# Patient Record
Sex: Female | Born: 1945 | Race: Black or African American | Hispanic: No | Marital: Married | Smoking: Former smoker
Health system: Southern US, Community
[De-identification: ages and names within clinical notes are randomized; demographics above are authoritative.]

## PROBLEM LIST (undated history)

## (undated) DIAGNOSIS — G8929 Other chronic pain: Secondary | ICD-10-CM

## (undated) DIAGNOSIS — I10 Essential (primary) hypertension: Secondary | ICD-10-CM

## (undated) DIAGNOSIS — I509 Heart failure, unspecified: Secondary | ICD-10-CM

## (undated) DIAGNOSIS — M47816 Spondylosis without myelopathy or radiculopathy, lumbar region: Secondary | ICD-10-CM

## (undated) DIAGNOSIS — R918 Other nonspecific abnormal finding of lung field: Secondary | ICD-10-CM

## (undated) DIAGNOSIS — F410 Panic disorder [episodic paroxysmal anxiety] without agoraphobia: Secondary | ICD-10-CM

## (undated) DIAGNOSIS — R601 Generalized edema: Secondary | ICD-10-CM

## (undated) DIAGNOSIS — E049 Nontoxic goiter, unspecified: Secondary | ICD-10-CM

## (undated) HISTORY — DX: Generalized edema: R60.1

## (undated) HISTORY — DX: Other chronic pain: G89.29

## (undated) HISTORY — DX: Other nonspecific abnormal finding of lung field: R91.8

## (undated) HISTORY — DX: Essential (primary) hypertension: I10

## (undated) HISTORY — DX: Nontoxic goiter, unspecified: E04.9

## (undated) HISTORY — PX: VAGINAL HYSTERECTOMY: SUR661

## (undated) HISTORY — DX: Spondylosis without myelopathy or radiculopathy, lumbar region: M47.816

## (undated) HISTORY — DX: Panic disorder (episodic paroxysmal anxiety): F41.0

---

## 1998-08-04 ENCOUNTER — Ambulatory Visit: Admission: RE | Admit: 1998-08-04 | Discharge: 1998-08-04 | Payer: Self-pay | Admitting: Gynecologic Oncology

## 1998-08-10 ENCOUNTER — Inpatient Hospital Stay (HOSPITAL_COMMUNITY): Admission: RE | Admit: 1998-08-10 | Discharge: 1998-08-13 | Payer: Self-pay | Admitting: Gynecology

## 1998-09-22 ENCOUNTER — Ambulatory Visit: Admission: RE | Admit: 1998-09-22 | Discharge: 1998-09-22 | Payer: Self-pay | Admitting: Gynecology

## 2005-04-24 HISTORY — PX: HERNIA REPAIR: SHX51

## 2012-02-12 ENCOUNTER — Encounter: Payer: Self-pay | Admitting: Critical Care Medicine

## 2012-02-13 ENCOUNTER — Encounter: Payer: Self-pay | Admitting: Critical Care Medicine

## 2012-02-13 ENCOUNTER — Ambulatory Visit (INDEPENDENT_AMBULATORY_CARE_PROVIDER_SITE_OTHER): Payer: BC Managed Care – PPO | Admitting: Critical Care Medicine

## 2012-02-13 ENCOUNTER — Other Ambulatory Visit (INDEPENDENT_AMBULATORY_CARE_PROVIDER_SITE_OTHER): Payer: BC Managed Care – PPO

## 2012-02-13 ENCOUNTER — Ambulatory Visit (HOSPITAL_COMMUNITY)
Admission: RE | Admit: 2012-02-13 | Discharge: 2012-02-13 | Disposition: A | Discharge status: Home / Self Care | Payer: BC Managed Care – PPO | Source: Ambulatory Visit | Attending: Critical Care Medicine | Admitting: Critical Care Medicine

## 2012-02-13 VITALS — BP 146/66 | HR 74 | Temp 98.2°F | Ht 64.0 in | Wt 204.6 lb

## 2012-02-13 DIAGNOSIS — R609 Edema, unspecified: Secondary | ICD-10-CM

## 2012-02-13 DIAGNOSIS — R0600 Dyspnea, unspecified: Secondary | ICD-10-CM

## 2012-02-13 DIAGNOSIS — R0989 Other specified symptoms and signs involving the circulatory and respiratory systems: Secondary | ICD-10-CM

## 2012-02-13 DIAGNOSIS — I10 Essential (primary) hypertension: Secondary | ICD-10-CM | POA: Insufficient documentation

## 2012-02-13 DIAGNOSIS — E049 Nontoxic goiter, unspecified: Secondary | ICD-10-CM | POA: Insufficient documentation

## 2012-02-13 DIAGNOSIS — I700 Atherosclerosis of aorta: Secondary | ICD-10-CM | POA: Insufficient documentation

## 2012-02-13 DIAGNOSIS — R918 Other nonspecific abnormal finding of lung field: Secondary | ICD-10-CM

## 2012-02-13 DIAGNOSIS — M47817 Spondylosis without myelopathy or radiculopathy, lumbosacral region: Secondary | ICD-10-CM

## 2012-02-13 DIAGNOSIS — I5032 Chronic diastolic (congestive) heart failure: Secondary | ICD-10-CM

## 2012-02-13 DIAGNOSIS — R601 Generalized edema: Secondary | ICD-10-CM

## 2012-02-13 DIAGNOSIS — G8929 Other chronic pain: Secondary | ICD-10-CM

## 2012-02-13 DIAGNOSIS — M47816 Spondylosis without myelopathy or radiculopathy, lumbar region: Secondary | ICD-10-CM | POA: Insufficient documentation

## 2012-02-13 DIAGNOSIS — F410 Panic disorder [episodic paroxysmal anxiety] without agoraphobia: Secondary | ICD-10-CM

## 2012-02-13 LAB — HEPATIC FUNCTION PANEL
ALT: 13 U/L (ref 0–35)
Alkaline Phosphatase: 81 U/L (ref 39–117)
Bilirubin, Direct: 0.1 mg/dL (ref 0.0–0.3)
Total Protein: 7.2 g/dL (ref 6.0–8.3)

## 2012-02-13 LAB — PROTIME-INR: INR: 1 ratio (ref 0.8–1.0)

## 2012-02-13 NOTE — Assessment & Plan Note (Signed)
Dyspnea ? Etiology.  Liver enlarged.  ?ascites.  Mild peripheral edema.  RHC showed mod pulm hypertension and LV diastolic dysfunction.  No recent Echo Plan No change in medications An echocardiogram will be obtained Liver function labs,  BNP,  Pulmonary function studies Ultrasound of liver will be obtained An overnight sleep oximetry will be obtained

## 2012-02-13 NOTE — Progress Notes (Signed)
Subjective:    Patient ID: Jenkins Rouge, female    DOB: 1946-02-15, 66 y.o.   MRN: [id number]  HPI Comments: Chronic dyspnea with exertion and leg/abd swelling for 2 months.   Shortness of Breath This is a chronic problem. The current episode started more than 1 month ago. The problem occurs daily (exertional only). The problem has been gradually worsening. Associated symptoms include leg swelling, orthopnea and a rash. Pertinent negatives include no abdominal pain, chest pain, coryza, ear pain, fever, headaches, neck pain, PND, rhinorrhea, sore throat, sputum production, swollen glands, syncope, vomiting or wheezing. The symptoms are aggravated by exercise, lying flat and occupational exposure (heavy exertion only, worked as a Programmer, applications with black dust ). Risk factors include smoking. She has tried nothing for the symptoms. Her past medical history is significant for pneumonia. There is no history of allergies, aspirin allergies, asthma, bronchiolitis, CAD, chronic lung disease, COPD, DVT, a heart failure, PE or a recent surgery. (PNA with admission :  last episode 11/12)   Past Medical History  Diagnosis Date  . Hypertension   . Chronic pain   . Goiter   . Panic attacks   . Anasarca   . Lumbar arthropathy     Pain clinic  . Multiple lung nodules     worked in Psychiatrist 79yrs: retired 2009     Family History  Problem Relation Age of Onset  . Heart failure Mother   . Hypertension Mother   . Hypertension Maternal Grandfather   . Hypertension Maternal Grandmother   . Stomach cancer Sister   . Diabetes type II Mother   . Diabetes Mellitus II Sister   . Cancer Father     mets everywhere  . Cancer Sister     stomach     History   Social History  . Marital Status: Married    Spouse Name: N/A    Number of Children: 3  . Years of Education: N/A   Occupational History  . retired     Optician, dispensing   Social History Main Topics  . Smoking status: Former Smoker  -- 0.5 packs/day for 20 years    Types: Cigarettes    Quit date: 04/24/1988  . Smokeless tobacco: Never Used  . Alcohol Use: No  . Drug Use: No  . Sexually Active: Not on file   Other Topics Concern  . Not on file   Social History Narrative  . No narrative on file     Allergies  Allergen Reactions  . Prednisone     "does thing and does not know she has done them" once taking this med  . Sulfa Antibiotics     Rash, SOB     Outpatient Prescriptions Prior to Visit  Medication Sig Dispense Refill  . ALPRAZolam (XANAX) 1 MG tablet Take 1 mg by mouth 3 (three) times daily as needed.      . cloNIDine (CATAPRES) 0.1 MG tablet Take 0.1 mg by mouth 2 (two) times daily.      Marland Kitchen diltiazem (TIAZAC) 180 MG 24 hr capsule Take 180 mg by mouth daily.      . furosemide (LASIX) 40 MG tablet 2 1/2 daily      . lisinopril (PRINIVIL,ZESTRIL) 20 MG tablet Take 20 mg by mouth daily.      Marland Kitchen oxycodone (ROXICODONE) 30 MG immediate release tablet Take 30 mg by mouth every 12 (twelve) hours as needed.      Marland Kitchen oxyCODONE-acetaminophen (PERCOCET) 7.5-500 MG  per tablet Take 1 tablet by mouth daily as needed.      . potassium chloride SA (K-DUR,KLOR-CON) 20 MEQ tablet Take 20 mEq by mouth daily.           Review of Systems  Constitutional: Positive for chills and fatigue. Negative for fever, diaphoresis, activity change, appetite change and unexpected weight change.  HENT: Positive for congestion and postnasal drip. Negative for hearing loss, ear pain, nosebleeds, sore throat, facial swelling, rhinorrhea, sneezing, mouth sores, trouble swallowing, neck pain, neck stiffness, dental problem, voice change, sinus pressure, tinnitus and ear discharge.   Eyes: Negative for photophobia, discharge, itching and visual disturbance.  Respiratory: Positive for shortness of breath. Negative for apnea, cough, sputum production, choking, chest tightness, wheezing and stridor.   Cardiovascular: Positive for orthopnea and leg  swelling. Negative for chest pain, palpitations, syncope and PND.  Gastrointestinal: Negative for nausea, vomiting, abdominal pain, constipation, blood in stool and abdominal distention.  Genitourinary: Negative for dysuria, urgency, frequency, hematuria, flank pain, decreased urine volume and difficulty urinating.  Musculoskeletal: Positive for gait problem. Negative for myalgias, back pain, joint swelling and arthralgias.  Skin: Positive for rash. Negative for color change and pallor.  Neurological: Positive for weakness and numbness. Negative for dizziness, tremors, seizures, syncope, speech difficulty, light-headedness and headaches.  Hematological: Negative for adenopathy. Does not bruise/bleed easily.  Psychiatric/Behavioral: Negative for confusion, disturbed wake/sleep cycle and agitation. The patient is nervous/anxious.        Objective:   Physical Exam  Filed Vitals:   02/13/12 1057  BP: 146/66  Pulse: 74  Temp: 98.2 F (36.8 C)  TempSrc: Oral  Height: 5\' 4"  (1.626 m)  Weight: 204 lb 9.6 oz (92.806 kg)  SpO2: 96%    Gen: Pleasant, well-nourished, in no distress,  normal affect  ENT: No lesions,  mouth clear,  oropharynx clear, no postnasal drip  Neck: No JVD, no TMG, no carotid bruits  Lungs: No use of accessory muscles, no dullness to percussion, clear without rales or rhonchi  Cardiovascular: RRR, heart sounds normal, no murmur or gallops, 1+  peripheral edema  Abdomen: soft and NT,liver 3-4 FB below RCM,   BS normal, no real ascites   Musculoskeletal: No deformities, no cyanosis or clubbing  Neuro: alert, non focal  Skin: Warm, no lesions or rashes  US Abdomen Complete  02/13/2012  *RADIOLOGY REPORT*  Clinical Data:  History of anasarca.  ABDOMINAL ULTRASOUND COMPLETE  Comparison:  CT 11/22/2011.  Findings:  Gallbladder: No shadowing gallstones or echogenic sludge. No gallbladder wall thickening or pericholecystic fluid. The gallbladder wall thickness  measured 1.7 mm. No sonographic Murphy's sign according to the ultrasound technologist.  CBD: Normal in caliber measuring 3.7 mm. No choledocholithiasis is evident.  Liver: Upper normal size with increased echogenicity of the hepatic parenchymal echotexture without focal parenchymal abnormality. Patency of the portal vein with hepatopetal flow.  IVC:  Patent throughout its visualized course in the abdomen.  Pancreas:  Although the pancreas is difficult to visualize in its entirety, no focal pancreatic abnormality is identified.  Spleen:  Normal size and echotexture without focal abnormality. Length is 7 cm.  Right kidney:  No hydronephrosis.  Well-preserved cortex.  Normal parenchymal echotexture without focal abnormalities.  Right renal length is 10.6 cm.  Left kidney:  No hydronephrosis.  Well-preserved cortex.  Normal parenchymal echotexture without focal abnormalities.  Left renal length is 10.3 cm.  Aorta:  Maximum diameter is 2.4 cm.  No aneurysm is evident. Ectasia and atherosclerotic  plaquing are present.  Ascites:  None.  IMPRESSION: Liver is upper normal size with increased echogenicity of hepatic parenchymal echotexture without focal parenchymal abnormality.  This appearance may be associated with fatty infiltration liver but is not specific for it.  No ascites is evident.  Atherosclerotic plaquing and ectasia of abdominal aorta are present.  No aneurysm is seen.   Original Report Authenticated By: Crawford Givens, M.D.          Assessment & Plan:   Dyspnea Dyspnea ? Etiology.  Liver enlarged.  ?ascites.  Mild peripheral edema.  RHC showed mod pulm hypertension and LV diastolic dysfunction.  No recent Echo Plan No change in medications An echocardiogram will be obtained Liver function labs,  BNP,  Pulmonary function studies Ultrasound of liver will be obtained An overnight sleep oximetry will be obtained    Updated Medication List Outpatient Encounter Prescriptions as of 02/13/2012    Medication Sig Dispense Refill  . ALPRAZolam (XANAX) 1 MG tablet Take 1 mg by mouth 3 (three) times daily as needed.      . cloNIDine (CATAPRES) 0.1 MG tablet Take 0.1 mg by mouth 2 (two) times daily.      Marland Kitchen diltiazem (TIAZAC) 180 MG 24 hr capsule Take 180 mg by mouth daily.      . furosemide (LASIX) 40 MG tablet 2 1/2 daily      . lisinopril (PRINIVIL,ZESTRIL) 20 MG tablet Take 20 mg by mouth daily.      Marland Kitchen oxycodone (ROXICODONE) 30 MG immediate release tablet Take 30 mg by mouth every 12 (twelve) hours as needed.      Marland Kitchen oxyCODONE-acetaminophen (PERCOCET) 7.5-500 MG per tablet Take 1 tablet by mouth daily as needed.      . potassium chloride SA (K-DUR,KLOR-CON) 20 MEQ tablet Take 20 mEq by mouth daily.

## 2012-02-13 NOTE — Patient Instructions (Addendum)
No change in medications An echocardiogram will be obtained Multiple labs will be obtained Pulmonary function studies Ultrasound of liver will be obtained An overnight sleep oximetry will be obtained I will call with results Return 3 weeks to regroup

## 2012-02-14 LAB — HEPATITIS B SURFACE ANTIBODY,QUALITATIVE: Hep B S Ab: NEGATIVE

## 2012-02-14 LAB — HEPATITIS C ANTIBODY: HCV Ab: NEGATIVE

## 2012-02-15 ENCOUNTER — Encounter: Payer: Self-pay | Admitting: Critical Care Medicine

## 2012-02-15 LAB — BRAIN NATRIURETIC PEPTIDE: Pro B Natriuretic peptide (BNP): 34 pg/mL (ref 0.0–100.0)

## 2012-02-15 NOTE — Progress Notes (Signed)
Quick Note:  Called, spoke with pt. Informed her of lab results and recs per Dr. Wright. She verbalized understanding. ______ 

## 2012-02-15 NOTE — Progress Notes (Signed)
Quick Note:  Call pt and tell her labs, including her liver labs are ok, No change in medications ______

## 2012-02-16 ENCOUNTER — Telehealth: Payer: Self-pay | Admitting: Critical Care Medicine

## 2012-02-16 DIAGNOSIS — R0600 Dyspnea, unspecified: Secondary | ICD-10-CM

## 2012-02-16 NOTE — Telephone Encounter (Signed)
Called and spoke with patient and she is aware of ONO results being positive with desaturations <88%. Pt is also aware that we have faxed an order over for [facility] to contact patient and arrange o2 delivery. Pt is aware that to use o2 at 2 lpm for sleep ONLY. Order faxed to [facility]. Rhonda J Cobb

## 2012-02-16 NOTE — Telephone Encounter (Signed)
ONO on RA positive with desaturation <88%   > 2.5 hrs

## 2012-02-20 ENCOUNTER — Other Ambulatory Visit (HOSPITAL_COMMUNITY): Payer: BC Managed Care – PPO

## 2012-02-21 ENCOUNTER — Telehealth: Payer: Self-pay | Admitting: Critical Care Medicine

## 2012-02-21 NOTE — Telephone Encounter (Signed)
Called, left msg for Kim with [facility] to see why pt refused o2.  Called pt's home # - [email or web address]  Called pt's cell # - went directly to VM - [email or web address]

## 2012-02-22 NOTE — Telephone Encounter (Signed)
LMOMTCB x 1 

## 2012-02-22 NOTE — Telephone Encounter (Signed)
Noted and thanks.

## 2012-02-22 NOTE — Telephone Encounter (Signed)
Called, spoke with pt.  Pt states she didn't get enough information about the o2 and the test results.  This is why she declined the o2.  She was unsure why she needed the o2.  I explained the results to her and explained that this would help her lungs and help her feel better.  I answered her questions.  She was very appreciative of this and willing to start on 2 lpm o2 qhs now.  Advised I would contact [facility] and have them call her on her cell, per her request, to get the this set up.  She verbalized understanding and voiced no further questions/concerns at this time.  I called [facility], spoke with Selena Batten.  [facility] will contact pt today to schedule a time to have o2 set up.    Will sign off and route to PW as FYI.

## 2012-02-27 ENCOUNTER — Encounter: Payer: Self-pay | Admitting: Critical Care Medicine

## 2012-03-11 ENCOUNTER — Ambulatory Visit: Payer: BC Managed Care – PPO | Admitting: Critical Care Medicine

## 2012-03-29 ENCOUNTER — Encounter: Payer: Self-pay | Admitting: Critical Care Medicine

## 2012-03-29 ENCOUNTER — Ambulatory Visit (INDEPENDENT_AMBULATORY_CARE_PROVIDER_SITE_OTHER): Payer: BC Managed Care – PPO | Admitting: Critical Care Medicine

## 2012-03-29 VITALS — BP 150/56 | HR 90 | Temp 97.8°F | Ht 65.0 in | Wt 200.0 lb

## 2012-03-29 DIAGNOSIS — R0609 Other forms of dyspnea: Secondary | ICD-10-CM

## 2012-03-29 DIAGNOSIS — R601 Generalized edema: Secondary | ICD-10-CM

## 2012-03-29 DIAGNOSIS — R0600 Dyspnea, unspecified: Secondary | ICD-10-CM

## 2012-03-29 DIAGNOSIS — R609 Edema, unspecified: Secondary | ICD-10-CM

## 2012-03-29 LAB — PULMONARY FUNCTION TEST

## 2012-03-29 NOTE — Patient Instructions (Addendum)
No evidence for primary lung process No true pulmonary high pressures No asthma or copd Low oxygen levels at night may be from weight and low lung volumes , poor drive to deep breath Stay on oxygen at night for now No other medication changes Return 6 months

## 2012-03-29 NOTE — Progress Notes (Signed)
PFT done. Tajae Maiolo, CMA  

## 2012-03-29 NOTE — Assessment & Plan Note (Addendum)
No true pulmonary HTN on repeat echo and on RHC pulm HTN was mild and pcw was high ONO on RA: Desaturation <88%  > 2.5hrs, now on 2L QHS RHC 11/2011: moderate pulmonary hypertension Echo 10/13: no pulm htn on echo repeated Echo 2/13: no true pulm htn PFTs mild restriction on TLC, moderate restriction on spiro. Moderate reduction in DLCO LFTs normal BNP normal Moderate LVH/ diastolic dysfunction chronic U/s of abd neg for ascites, only hepatic fatty liver Plan pulm f/u prn Cont nocturnal oxygen therapy F/u sleep study results

## 2012-03-31 NOTE — Progress Notes (Signed)
Subjective:    Patient ID: Roberta Torres, female    DOB: 07/13/1945, 66 y.o.   MRN: [id number]  HPI Comments: Chronic dyspnea with exertion and leg/abd swelling for 2 months.   Shortness of Breath This is a chronic problem. The current episode started more than 1 month ago. The problem occurs daily (exertional only). The problem has been gradually worsening. Associated symptoms include leg swelling, orthopnea and a rash. Pertinent negatives include no abdominal pain, chest pain, coryza, ear pain, fever, headaches, neck pain, PND, rhinorrhea, sore throat, sputum production, swollen glands, syncope, vomiting or wheezing. The symptoms are aggravated by exercise, lying flat and occupational exposure (heavy exertion only, worked as a Programmer, applications with black dust ). Risk factors include smoking. She has tried nothing for the symptoms. Her past medical history is significant for pneumonia. There is no history of allergies, aspirin allergies, asthma, bronchiolitis, CAD, chronic lung disease, COPD, DVT, a heart failure, PE or a recent surgery. (PNA with admission :  last episode 11/12)   03/29/2012 Dyspnea is largely unchanged. HERE TO review tests Past Medical History  Diagnosis Date  . Hypertension   . Chronic pain   . Goiter   . Panic attacks   . Anasarca   . Lumbar arthropathy     Pain clinic  . Multiple lung nodules     worked in Psychiatrist 32yrs: retired 2009     Family History  Problem Relation Age of Onset  . Heart failure Mother   . Hypertension Mother   . Hypertension Maternal Grandfather   . Hypertension Maternal Grandmother   . Stomach cancer Sister   . Diabetes type II Mother   . Diabetes Mellitus II Sister   . Cancer Father     mets everywhere  . Cancer Sister     stomach     History   Social History  . Marital Status: Married    Spouse Name: N/A    Number of Children: 3  . Years of Education: N/A   Occupational History  . retired     Optician, dispensing    Social History Main Topics  . Smoking status: Former Smoker -- 0.5 packs/day for 20 years    Types: Cigarettes    Quit date: 04/24/1988  . Smokeless tobacco: Never Used  . Alcohol Use: No  . Drug Use: No  . Sexually Active: Not on file   Other Topics Concern  . Not on file   Social History Narrative  . No narrative on file     Allergies  Allergen Reactions  . Prednisone     "does thing and does not know she has done them" once taking this med  . Sulfa Antibiotics     Rash, SOB     Outpatient Prescriptions Prior to Visit  Medication Sig Dispense Refill  . ALPRAZolam (XANAX) 1 MG tablet Take 1 mg by mouth 3 (three) times daily as needed.      . furosemide (LASIX) 40 MG tablet Take 60 mg by mouth daily.       Marland Kitchen oxycodone (ROXICODONE) 30 MG immediate release tablet Take 30 mg by mouth every 12 (twelve) hours as needed.      Marland Kitchen oxyCODONE-acetaminophen (PERCOCET) 7.5-500 MG per tablet Take 1 tablet by mouth daily as needed.      . potassium chloride SA (K-DUR,KLOR-CON) 20 MEQ tablet Take 20 mEq by mouth daily.      . [DISCONTINUED] cloNIDine (CATAPRES) 0.1 MG tablet Take 0.1  mg by mouth 2 (two) times daily.      . [DISCONTINUED] diltiazem (TIAZAC) 180 MG 24 hr capsule Take 180 mg by mouth daily.      . [DISCONTINUED] lisinopril (PRINIVIL,ZESTRIL) 20 MG tablet Take 20 mg by mouth daily.      Last reviewed on 03/29/2012  2:40 PM by Storm Frisk, MD     Review of Systems  Constitutional: Positive for chills and fatigue. Negative for fever, diaphoresis, activity change, appetite change and unexpected weight change.  HENT: Positive for congestion and postnasal drip. Negative for hearing loss, ear pain, nosebleeds, sore throat, facial swelling, rhinorrhea, sneezing, mouth sores, trouble swallowing, neck pain, neck stiffness, dental problem, voice change, sinus pressure, tinnitus and ear discharge.   Eyes: Negative for photophobia, discharge, itching and visual disturbance.   Respiratory: Positive for shortness of breath. Negative for apnea, cough, sputum production, choking, chest tightness, wheezing and stridor.   Cardiovascular: Positive for orthopnea and leg swelling. Negative for chest pain, palpitations, syncope and PND.  Gastrointestinal: Negative for nausea, vomiting, abdominal pain, constipation, blood in stool and abdominal distention.  Genitourinary: Negative for dysuria, urgency, frequency, hematuria, flank pain, decreased urine volume and difficulty urinating.  Musculoskeletal: Positive for gait problem. Negative for myalgias, back pain, joint swelling and arthralgias.  Skin: Positive for rash. Negative for color change and pallor.  Neurological: Positive for weakness and numbness. Negative for dizziness, tremors, seizures, syncope, speech difficulty, light-headedness and headaches.  Hematological: Negative for adenopathy. Does not bruise/bleed easily.  Psychiatric/Behavioral: Negative for confusion, sleep disturbance and agitation. The patient is nervous/anxious.        Objective:   Physical Exam   Filed Vitals:   03/29/12 1418  BP: 150/56  Pulse: 90  Temp: 97.8 F (36.6 C)  TempSrc: Oral  Height: 5\' 5"  (1.651 m)  Weight: 200 lb (90.719 kg)  SpO2: 97%    Gen: Pleasant, well-nourished, in no distress,  normal affect  ENT: No lesions,  mouth clear,  oropharynx clear, no postnasal drip  Neck: No JVD, no TMG, no carotid bruits  Lungs: No use of accessory muscles, no dullness to percussion, clear without rales or rhonchi  Cardiovascular: RRR, heart sounds normal, no murmur or gallops, 1+  peripheral edema  Abdomen: soft and NT,liver 3-4 FB below RCM,   BS normal, no real ascites   Musculoskeletal: No deformities, no cyanosis or clubbing  Neuro: alert, non focal  Skin: Warm, no lesions or rashes  PFTs  12/6 FeV1 60%  FeV1/FVC 83%  TLC 78%  DLCO/Va 112%  C/w restriction,  No obstruction       Assessment & Plan:   Dyspnea No  true pulmonary HTN on repeat echo and on RHC pulm HTN was mild and pcw was high ONO on RA: Desaturation <88%  > 2.5hrs, now on 2L QHS RHC 11/2011: moderate pulmonary hypertension Echo 10/13: no pulm htn on echo repeated Echo 2/13: no true pulm htn PFTs mild restriction on TLC, moderate restriction on spiro. Moderate reduction in DLCO LFTs normal BNP normal Moderate LVH/ diastolic dysfunction chronic U/s of abd neg for ascites, only hepatic fatty liver Plan pulm f/u prn Cont nocturnal oxygen therapy F/u sleep study results      Updated Medication List Outpatient Encounter Prescriptions as of 03/29/2012  Medication Sig Dispense Refill  . ALPRAZolam (XANAX) 1 MG tablet Take 1 mg by mouth 3 (three) times daily as needed.      Marland Kitchen aspirin 81 MG tablet Take 81 mg  by mouth daily.      . furosemide (LASIX) 40 MG tablet Take 60 mg by mouth daily.       Marland Kitchen lisinopril (PRINIVIL,ZESTRIL) 30 MG tablet Take 30 mg by mouth daily.      Marland Kitchen oxycodone (ROXICODONE) 30 MG immediate release tablet Take 30 mg by mouth every 12 (twelve) hours as needed.      Marland Kitchen oxyCODONE-acetaminophen (PERCOCET) 7.5-500 MG per tablet Take 1 tablet by mouth daily as needed.      . potassium chloride SA (K-DUR,KLOR-CON) 20 MEQ tablet Take 20 mEq by mouth daily.      . [DISCONTINUED] cloNIDine (CATAPRES) 0.1 MG tablet Take 0.1 mg by mouth 2 (two) times daily.      . [DISCONTINUED] diltiazem (TIAZAC) 180 MG 24 hr capsule Take 180 mg by mouth daily.      . [DISCONTINUED] lisinopril (PRINIVIL,ZESTRIL) 20 MG tablet Take 20 mg by mouth daily.

## 2012-04-04 ENCOUNTER — Encounter: Payer: Self-pay | Admitting: Critical Care Medicine

## 2012-04-16 ENCOUNTER — Encounter: Payer: Self-pay | Admitting: Critical Care Medicine

## 2012-10-30 ENCOUNTER — Ambulatory Visit: Payer: BC Managed Care – PPO | Admitting: Critical Care Medicine

## 2016-11-17 DIAGNOSIS — R600 Localized edema: Secondary | ICD-10-CM | POA: Diagnosis not present

## 2016-11-17 DIAGNOSIS — I509 Heart failure, unspecified: Secondary | ICD-10-CM | POA: Diagnosis not present

## 2016-11-19 DIAGNOSIS — E8770 Fluid overload, unspecified: Secondary | ICD-10-CM

## 2016-11-19 DIAGNOSIS — I509 Heart failure, unspecified: Secondary | ICD-10-CM

## 2016-11-19 DIAGNOSIS — I10 Essential (primary) hypertension: Secondary | ICD-10-CM | POA: Diagnosis not present

## 2016-11-19 DIAGNOSIS — R0902 Hypoxemia: Secondary | ICD-10-CM | POA: Diagnosis not present

## 2016-11-19 DIAGNOSIS — E119 Type 2 diabetes mellitus without complications: Secondary | ICD-10-CM

## 2016-11-20 DIAGNOSIS — E8770 Fluid overload, unspecified: Secondary | ICD-10-CM | POA: Diagnosis not present

## 2016-11-20 DIAGNOSIS — R0902 Hypoxemia: Secondary | ICD-10-CM | POA: Diagnosis not present

## 2016-11-20 DIAGNOSIS — E119 Type 2 diabetes mellitus without complications: Secondary | ICD-10-CM | POA: Diagnosis not present

## 2016-11-20 DIAGNOSIS — I509 Heart failure, unspecified: Secondary | ICD-10-CM | POA: Diagnosis not present

## 2016-11-21 DIAGNOSIS — R0902 Hypoxemia: Secondary | ICD-10-CM | POA: Diagnosis not present

## 2016-11-21 DIAGNOSIS — E119 Type 2 diabetes mellitus without complications: Secondary | ICD-10-CM | POA: Diagnosis not present

## 2016-11-21 DIAGNOSIS — I509 Heart failure, unspecified: Secondary | ICD-10-CM | POA: Diagnosis not present

## 2016-11-21 DIAGNOSIS — E8770 Fluid overload, unspecified: Secondary | ICD-10-CM | POA: Diagnosis not present

## 2016-11-23 DIAGNOSIS — K219 Gastro-esophageal reflux disease without esophagitis: Secondary | ICD-10-CM

## 2016-11-23 DIAGNOSIS — J45909 Unspecified asthma, uncomplicated: Secondary | ICD-10-CM

## 2016-11-23 DIAGNOSIS — J449 Chronic obstructive pulmonary disease, unspecified: Secondary | ICD-10-CM

## 2016-11-23 DIAGNOSIS — Z953 Presence of xenogenic heart valve: Secondary | ICD-10-CM

## 2016-11-23 DIAGNOSIS — I509 Heart failure, unspecified: Secondary | ICD-10-CM

## 2016-11-23 DIAGNOSIS — E119 Type 2 diabetes mellitus without complications: Secondary | ICD-10-CM

## 2016-11-23 NOTE — Assessment & Plan Note (Signed)
Acute on chronic diastolic CHF, EF 25%

## 2016-11-28 ENCOUNTER — Ambulatory Visit: Payer: Self-pay | Admitting: Cardiology

## 2016-11-29 ENCOUNTER — Emergency Department (HOSPITAL_BASED_OUTPATIENT_CLINIC_OR_DEPARTMENT_OTHER)
Admission: EM | Admit: 2016-11-29 | Discharge: 2016-11-29 | Disposition: A | Discharge status: Home / Self Care | Payer: Medicare HMO | Attending: Emergency Medicine | Admitting: Emergency Medicine

## 2016-11-29 ENCOUNTER — Ambulatory Visit: Payer: Self-pay | Admitting: Cardiology

## 2016-11-29 DIAGNOSIS — Z5321 Procedure and treatment not carried out due to patient leaving prior to being seen by health care provider: Secondary | ICD-10-CM | POA: Insufficient documentation

## 2016-11-29 DIAGNOSIS — I509 Heart failure, unspecified: Secondary | ICD-10-CM | POA: Insufficient documentation

## 2016-11-29 NOTE — ED Triage Notes (Signed)
See downtime documentation for triage information

## 2016-11-29 NOTE — ED Notes (Signed)
Patient was told the RN that she was able to get an appointment at her Dr's upstairs and left after triaged

## 2016-12-12 ENCOUNTER — Ambulatory Visit (INDEPENDENT_AMBULATORY_CARE_PROVIDER_SITE_OTHER): Payer: Medicare HMO | Admitting: Cardiology

## 2016-12-12 ENCOUNTER — Encounter: Payer: Self-pay | Admitting: Cardiology

## 2016-12-12 VITALS — BP 116/64 | HR 76 | Resp 14 | Ht 65.0 in | Wt 186.8 lb

## 2016-12-12 DIAGNOSIS — G4733 Obstructive sleep apnea (adult) (pediatric): Secondary | ICD-10-CM | POA: Diagnosis not present

## 2016-12-12 DIAGNOSIS — R0602 Shortness of breath: Secondary | ICD-10-CM | POA: Diagnosis not present

## 2016-12-12 DIAGNOSIS — I5032 Chronic diastolic (congestive) heart failure: Secondary | ICD-10-CM

## 2016-12-12 DIAGNOSIS — E119 Type 2 diabetes mellitus without complications: Secondary | ICD-10-CM

## 2016-12-12 DIAGNOSIS — I270 Primary pulmonary hypertension: Secondary | ICD-10-CM

## 2016-12-12 DIAGNOSIS — J410 Simple chronic bronchitis: Secondary | ICD-10-CM | POA: Diagnosis not present

## 2016-12-12 NOTE — Progress Notes (Signed)
Cardiology Consultation:    Date:  12/12/2016   ID:  Roberta Torres, DOB 1945/08/08, MRN [id number]  PCP:  Raelyn Number, MD  Cardiologist:  Jenne Campus, MD   Referring MD: Raelyn Number, MD   Chief Complaint  Patient presents with  . Hospitalization Follow-up  I'm still having difficulties  History of Present Illness:    Roberta Torres is a 71 y.o. female who is being seen today for the evaluation of Congestive heart failure at the request of Haque, Imran P, MD. Patient is a 82 is a woman with hypertension, diabetes, long-term smoker who was recently admitted to [facility] because of swelling and shortness of breath. Echo cardiac exam was done which showed preserved left ventricular ejection fraction, she was fine to have diastolic dysfunction. Also pulmonary hypertension was noted. She was treated with diuretics and discharged home however, she tells me that she did not pose any better. Still does have exertional shortness of breath. Still does have some swelling of lower extremities this coming back. There is no paroxysmal nocturnal dyspnea but she has to get up many times during the night and urinate. Denies having any chest pain tightness squeezing pressure burning chest, no palpitations. I did review records from [facility] carefully.  Past Medical History:  Diagnosis Date  . Anasarca   . Chronic pain   . Goiter   . Hypertension   . Lumbar arthropathy (HCC)    Pain clinic  . Multiple lung nodules    worked in Tourist information centre manager 22yrs: retired 2009  . Panic attacks     Past Surgical History:  Procedure Laterality Date  . HERNIA REPAIR  2007  . VAGINAL HYSTERECTOMY      Current Medications: Current Meds  Medication Sig  . carvedilol (COREG) 12.5 MG tablet Take 12.5 mg by mouth 2 (two) times daily.  . furosemide (LASIX) 80 MG tablet Take 80 mg by mouth 2 (two) times daily.   . hydrALAZINE (APRESOLINE) 100 MG tablet Take 100 mg by mouth 3 (three)  times daily.  . potassium chloride SA (K-DUR,KLOR-CON) 20 MEQ tablet Take 20 mEq by mouth 2 (two) times daily.      Allergies:   Moxifloxacin; Prednisone; and Sulfa antibiotics   Social History   Social History  . Marital status: Married    Spouse name: N/A  . Number of children: 3  . Years of education: N/A   Occupational History  . retired     Banker   Social History Main Topics  . Smoking status: Former Smoker    Packs/day: 0.50    Years: 20.00    Types: Cigarettes    Quit date: 04/24/1988  . Smokeless tobacco: Never Used  . Alcohol use No  . Drug use: No  . Sexual activity: Not Asked   Other Topics Concern  . None   Social History Narrative  . None     Family History: The patient's family history includes Cancer in her father and sister; Diabetes Mellitus II in her sister; Diabetes type II in her mother; Heart failure in her mother; Hypertension in her maternal grandfather, maternal grandmother, and mother; Sarcoidosis in her mother; Stomach cancer in her sister. ROS:   Please see the history of present illness.    All 14 point review of systems negative except as described per history of present illness.  EKGs/Labs/Other Studies Reviewed:    The following studies were reviewed today: Echocardiogram showing preserved ejection fraction  biatrial enlargement, pulmonary artery pressure 57 mmHg.   Echocardiogram showed normal sinus rhythm, normal PR interval, nonspecific ST segment changes.  Recent Labs: No results found for requested labs within last 8760 hours.  Recent Lipid Panel No results found for: CHOL, TRIG, HDL, CHOLHDL, VLDL, LDLCALC, LDLDIRECT  Physical Exam:    VS:  BP 116/64   Pulse 76   Resp 14   Ht 5\' 5"  (1.651 m)   Wt 186 lb 12.8 oz (84.7 kg)   BMI 31.09 kg/m     Wt Readings from Last 3 Encounters:  12/12/16 186 lb 12.8 oz (84.7 kg)  03/29/12 200 lb (90.7 kg)  02/13/12 204 lb 9.6 oz (92.8 kg)     GEN:  Well nourished, well  developed in no acute distress HEENT: Normal NECK: No JVD; No carotid bruits LYMPHATICS: No lymphadenopathy CARDIAC: RRR, no murmurs, no rubs, no gallops RESPIRATORY:  Clear to auscultation without rales, wheezing or rhonchi  ABDOMEN: Soft, non-tender, non-distended MUSCULOSKELETAL:  No edema; No deformity  SKIN: Warm and dry NEUROLOGIC:  Alert and oriented x 3 PSYCHIATRIC:  Normal affect   ASSESSMENT:    1. Chronic diastolic heart failure, NYHA class 3 ([place])   2. Simple chronic bronchitis ([place])   3. Type 2 diabetes mellitus without complication, without long-term current use of insulin (HCC)    PLAN:    In order of problems listed above:  Chronic diastolic congestive heart failure. Interestingly her proBNP was normal while she was in the hospital. I will ask her to have proBNP as well as Chem-7 repeated today. I suspect the reason for her problem is mostly pulmonary hypertension rather than diastolic congestive heart failure. She does have swelling of lower extremities, she does have elevated JVD. Pulmonary hypertension: So far the etiology of this phenomenon is unclear. She does have sleep apnea and she is using only oxygen for it the last sleep study she had done was about a year ago. She is being follow up by pulmonologist in [place]. I will ask her to have CT angiogram of her chest to rule out pulmonary emboli. She does have COPD which appears to be quite significant but that pulmonary pressure 57's and usually just for COPD. I will not change any of her medication moment until we have more clear understanding of problems. Type 2 diabetes, its follow-up by primary care physician  Medication Adjustments/Labs and Tests Ordered: Current medicines are reviewed at length with the patient today.  Concerns regarding medicines are outlined above.  No orders of the defined types were placed in this encounter.  No orders of the defined types were placed in this  encounter.   Signed, Park Liter, MD, [facility]. 12/12/2016 10:07 AM    [place]

## 2016-12-12 NOTE — Patient Instructions (Signed)
Medication Instructions:  Your physician recommends that you continue on your current medications as directed. Please refer to the Current Medication list given to you today.  Labwork: Your physician recommends that you have lab work today to check your kidney function, sodium and potassium level as well as check for fluid build up.   Testing/Procedures: Non-Cardiac CT Angiography (CTA), is a special type of CT scan that uses a computer to produce multi-dimensional views of major blood vessels throughout the body. In CT angiography, a contrast material is injected through an IV to help visualize the blood vessels.    Follow-Up: Your physician recommends that you schedule a follow-up appointment in: 2 weeks   Any Other Special Instructions Will Be Listed Below (If Applicable).  Please note that any paperwork needing to be filled out by the provider will need to be addressed at the front desk prior to seeing the provider. Please note that any paperwork FMLA, Disability or other documents regarding health condition is subject to a $25.00 charge that must be received prior to completion of paperwork in the form of a money order or check.    If you need a refill on your cardiac medications before your next appointment, please call your pharmacy.

## 2016-12-13 LAB — BASIC METABOLIC PANEL
BUN/Creatinine Ratio: 7 — ABNORMAL LOW (ref 12–28)
BUN: 6 mg/dL — ABNORMAL LOW (ref 8–27)
CALCIUM: 9.6 mg/dL (ref 8.7–10.3)
CO2: 33 mmol/L — AB (ref 20–29)
Chloride: 92 mmol/L — ABNORMAL LOW (ref 96–106)
Creatinine, Ser: 0.85 mg/dL (ref 0.57–1.00)
GFR calc Af Amer: 81 mL/min/{1.73_m2} (ref >59)
GFR, EST NON AFRICAN AMERICAN: 70 mL/min/{1.73_m2} (ref >59)
Glucose: 138 mg/dL — ABNORMAL HIGH (ref 65–99)
Potassium: 4.1 mmol/L (ref 3.5–5.2)
SODIUM: 141 mmol/L (ref 134–144)

## 2016-12-13 LAB — PRO B NATRIURETIC PEPTIDE: NT-Pro BNP: 114 pg/mL (ref 0–301)

## 2016-12-15 ENCOUNTER — Encounter (HOSPITAL_BASED_OUTPATIENT_CLINIC_OR_DEPARTMENT_OTHER): Payer: Self-pay

## 2016-12-15 ENCOUNTER — Ambulatory Visit (HOSPITAL_BASED_OUTPATIENT_CLINIC_OR_DEPARTMENT_OTHER)
Admission: RE | Admit: 2016-12-15 | Discharge: 2016-12-15 | Disposition: A | Discharge status: Home / Self Care | Payer: Medicare HMO | Source: Ambulatory Visit | Attending: Cardiology | Admitting: Cardiology

## 2016-12-15 DIAGNOSIS — R590 Localized enlarged lymph nodes: Secondary | ICD-10-CM | POA: Insufficient documentation

## 2016-12-15 DIAGNOSIS — I2510 Atherosclerotic heart disease of native coronary artery without angina pectoris: Secondary | ICD-10-CM | POA: Diagnosis not present

## 2016-12-15 DIAGNOSIS — R918 Other nonspecific abnormal finding of lung field: Secondary | ICD-10-CM | POA: Diagnosis not present

## 2016-12-15 DIAGNOSIS — I700 Atherosclerosis of aorta: Secondary | ICD-10-CM | POA: Diagnosis not present

## 2016-12-15 DIAGNOSIS — D3502 Benign neoplasm of left adrenal gland: Secondary | ICD-10-CM | POA: Insufficient documentation

## 2016-12-15 DIAGNOSIS — R0602 Shortness of breath: Secondary | ICD-10-CM

## 2016-12-15 HISTORY — DX: Heart failure, unspecified: I50.9

## 2016-12-15 MED ORDER — IOPAMIDOL (ISOVUE-370) INJECTION 76%
100.0000 mL | Freq: Once | INTRAVENOUS | Status: AC | PRN
  Administered 2016-12-15: 100 mL via INTRAVENOUS

## 2016-12-20 ENCOUNTER — Telehealth: Payer: Self-pay

## 2016-12-20 DIAGNOSIS — R918 Other nonspecific abnormal finding of lung field: Secondary | ICD-10-CM

## 2016-12-20 NOTE — Telephone Encounter (Signed)
-----   Message from Park Liter, MD sent at 12/15/2016  3:19 PM EDT ----- No PE, copy to PMD and Pulmonary,, she has pulmonary nodules

## 2016-12-20 NOTE — Telephone Encounter (Signed)
Multiple attempts to reach pt. CTA results were sent to PCP and referral has been made to pulmonary to address lung nodules.

## 2016-12-27 ENCOUNTER — Ambulatory Visit: Payer: Medicare HMO | Admitting: Cardiology

## 2017-01-26 ENCOUNTER — Ambulatory Visit (INDEPENDENT_AMBULATORY_CARE_PROVIDER_SITE_OTHER): Payer: Medicare HMO | Admitting: Cardiology

## 2017-01-26 ENCOUNTER — Encounter: Payer: Self-pay | Admitting: Cardiology

## 2017-01-26 VITALS — BP 130/62 | HR 76 | Resp 14 | Ht 65.0 in | Wt 181.9 lb

## 2017-01-26 DIAGNOSIS — G4733 Obstructive sleep apnea (adult) (pediatric): Secondary | ICD-10-CM

## 2017-01-26 DIAGNOSIS — D869 Sarcoidosis, unspecified: Secondary | ICD-10-CM | POA: Diagnosis not present

## 2017-01-26 DIAGNOSIS — E119 Type 2 diabetes mellitus without complications: Secondary | ICD-10-CM

## 2017-01-26 DIAGNOSIS — I10 Essential (primary) hypertension: Secondary | ICD-10-CM

## 2017-01-26 DIAGNOSIS — I5032 Chronic diastolic (congestive) heart failure: Secondary | ICD-10-CM

## 2017-01-26 DIAGNOSIS — I270 Primary pulmonary hypertension: Secondary | ICD-10-CM

## 2017-01-26 MED ORDER — HYDRALAZINE HCL 100 MG PO TABS
100.0000 mg | ORAL_TABLET | Freq: Three times a day (TID) | ORAL | 6 refills | Status: DC
Start: 2017-01-26 — End: 2018-05-22

## 2017-01-26 NOTE — Progress Notes (Signed)
Cardiology Office Note:    Date:  01/26/2017   ID:  Roberta Torres, DOB [birth year], MRN [id number]  PCP:  Raelyn Number, MD  Cardiologist:  Jenne Campus, MD    Referring MD: Raelyn Number, MD   Chief Complaint  Patient presents with  . 2 week follow up  And doing much better  History of Present Illness:    Roberta Torres is a 71 y.o. female  with diastolic congestive heart failure as well as pulmonary hypertension, she is feeling significantly better blood shortness of breath less swelling of lower extremities. She did have CT of her chest and a multiple concerning findings on the CT. I will schedule her to see pulmonologist.  Past Medical History:  Diagnosis Date  . Anasarca   . CHF (congestive heart failure) ([place])   . Chronic pain   . Goiter   . Hypertension   . Lumbar arthropathy    Pain clinic  . Multiple lung nodules    worked in Tourist information centre manager 60yrs: retired 2009  . Panic attacks     Past Surgical History:  Procedure Laterality Date  . HERNIA REPAIR  2007  . VAGINAL HYSTERECTOMY      Current Medications: Current Meds  Medication Sig  . carvedilol (COREG) 12.5 MG tablet Take 12.5 mg by mouth 2 (two) times daily.  . citalopram (CELEXA) 20 MG tablet Take 20 mg by mouth daily.  . furosemide (LASIX) 80 MG tablet Take 80 mg by mouth 2 (two) times daily.   . hydrALAZINE (APRESOLINE) 100 MG tablet Take 100 mg by mouth 3 (three) times daily.  . potassium chloride SA (K-DUR,KLOR-CON) 20 MEQ tablet Take 20 mEq by mouth 2 (two) times daily.   . promethazine (PHENERGAN) 25 MG tablet Take 25 mg by mouth every 8 (eight) hours as needed for nausea or vomiting.     Allergies:   Moxifloxacin; Prednisone; and Sulfa antibiotics   Social History   Social History  . Marital status: Married    Spouse name: N/A  . Number of children: 3  . Years of education: N/A   Occupational History  . retired     Banker   Social History Main Topics  . Smoking  status: Former Smoker    Packs/day: 0.50    Years: 20.00    Types: Cigarettes    Quit date: 04/24/1988  . Smokeless tobacco: Never Used  . Alcohol use No  . Drug use: No  . Sexual activity: Not Asked   Other Topics Concern  . None   Social History Narrative  . None     Family History: The patient's family history includes Cancer in her father and sister; Diabetes Mellitus II in her sister; Diabetes type II in her mother; Heart failure in her mother; Hypertension in her maternal grandfather, maternal grandmother, and mother; Sarcoidosis in her mother; Stomach cancer in her sister. ROS:   Please see the history of present illness.    All 14 point review of systems negative except as described per history of present illness  EKGs/Labs/Other Studies Reviewed:      Recent Labs: 12/12/2016: BUN 6; Creatinine, Ser 0.85; NT-Pro BNP 114; Potassium 4.1; Sodium 141  Recent Lipid Panel No results found for: CHOL, TRIG, HDL, CHOLHDL, VLDL, LDLCALC, LDLDIRECT  Physical Exam:    VS:  BP 130/62   Pulse 76   Resp 14   Ht 5\' 5"  (1.651 m)   Wt 181 lb 14.4 oz (82.5  kg)   BMI 30.27 kg/m     Wt Readings from Last 3 Encounters:  01/26/17 181 lb 14.4 oz (82.5 kg)  12/12/16 186 lb 12.8 oz (84.7 kg)  03/29/12 200 lb (90.7 kg)     GEN:  Well nourished, well developed in no acute distress HEENT: Normal NECK: No JVD; No carotid bruits LYMPHATICS: No lymphadenopathy CARDIAC: RRR, no murmurs, no rubs, no gallops RESPIRATORY:  Clear to auscultation without rales, wheezing or rhonchi  ABDOMEN: Soft, non-tender, non-distended MUSCULOSKELETAL:  No edema; No deformity  SKIN: Warm and dry LOWER EXTREMITIES: no swelling NEUROLOGIC:  Alert and oriented x 3 PSYCHIATRIC:  Normal affect   ASSESSMENT:    1. Pulmonary hypertension, primary (HCC)   2. Sarcoidosis   3. Obstructive sleep apnea   4. Type 2 diabetes mellitus without complication, without long-term current use of insulin ([place])   5.  Essential hypertension   6. Chronic diastolic congestive heart failure ([place])    PLAN:    In order of problems listed above:  1. Pulmonary hypertension: She does have sleep apnea approximately it is managed only with oxygen. She will be scheduled to see pulmonologist which can be helpful in managing this problem as well. 2. Sarcoidosis: A portable with pulmonologist will be made. 3. Congestive heart rate: Compensated now I will check proBNP as well as Chem-7 today. 4. Essential hypertension: We'll continue present management Is well controlled. 5. Abnormal CT: Again she be referred to pulmonologist.   Medication Adjustments/Labs and Tests Ordered: Current medicines are reviewed at length with the patient today.  Concerns regarding medicines are outlined above.  No orders of the defined types were placed in this encounter.  Medication changes: No orders of the defined types were placed in this encounter.   Signed, Park Liter, MD, [facility] 01/26/2017 11:02 AM    [place]

## 2017-01-26 NOTE — Patient Instructions (Signed)
Medication Instructions:  Your physician recommends that you continue on your current medications as directed. Please refer to the Current Medication list given to you today.  Labwork: Your physician recommends that you return for lab work in: BMP and BNP   Testing/Procedures: None ordered  Follow-Up: Your physician recommends that you schedule a follow-up appointment in: 2 months   Any Other Special Instructions Will Be Listed Below (If Applicable).     If you need a refill on your cardiac medications before your next appointment, please call your pharmacy.

## 2017-01-27 LAB — BASIC METABOLIC PANEL
BUN / CREAT RATIO: 8 — AB (ref 12–28)
BUN: 6 mg/dL — ABNORMAL LOW (ref 8–27)
CALCIUM: 9.3 mg/dL (ref 8.7–10.3)
CHLORIDE: 89 mmol/L — AB (ref 96–106)
CO2: 32 mmol/L — AB (ref 20–29)
Creatinine, Ser: 0.76 mg/dL (ref 0.57–1.00)
GFR calc non Af Amer: 80 mL/min/{1.73_m2} (ref >59)
GFR, EST AFRICAN AMERICAN: 93 mL/min/{1.73_m2} (ref >59)
Glucose: 114 mg/dL — ABNORMAL HIGH (ref 65–99)
Potassium: 3.3 mmol/L — ABNORMAL LOW (ref 3.5–5.2)
SODIUM: 137 mmol/L (ref 134–144)

## 2017-01-27 LAB — BRAIN NATRIURETIC PEPTIDE: BNP: 37.7 pg/mL (ref 0.0–100.0)

## 2017-02-12 ENCOUNTER — Ambulatory Visit (INDEPENDENT_AMBULATORY_CARE_PROVIDER_SITE_OTHER): Payer: Medicare HMO | Admitting: Internal Medicine

## 2017-02-12 ENCOUNTER — Telehealth: Payer: Self-pay | Admitting: Internal Medicine

## 2017-02-12 ENCOUNTER — Ambulatory Visit (INDEPENDENT_AMBULATORY_CARE_PROVIDER_SITE_OTHER)
Admission: RE | Admit: 2017-02-12 | Discharge: 2017-02-12 | Disposition: A | Discharge status: Home / Self Care | Payer: Medicare HMO | Source: Ambulatory Visit | Attending: Internal Medicine | Admitting: Internal Medicine

## 2017-02-12 ENCOUNTER — Encounter: Payer: Self-pay | Admitting: Internal Medicine

## 2017-02-12 ENCOUNTER — Other Ambulatory Visit (INDEPENDENT_AMBULATORY_CARE_PROVIDER_SITE_OTHER): Payer: Medicare HMO

## 2017-02-12 VITALS — BP 134/74 | HR 74 | Ht 65.0 in | Wt 181.0 lb

## 2017-02-12 DIAGNOSIS — J45991 Cough variant asthma: Secondary | ICD-10-CM

## 2017-02-12 DIAGNOSIS — J9612 Chronic respiratory failure with hypercapnia: Secondary | ICD-10-CM | POA: Diagnosis not present

## 2017-02-12 DIAGNOSIS — R918 Other nonspecific abnormal finding of lung field: Secondary | ICD-10-CM

## 2017-02-12 DIAGNOSIS — R0609 Other forms of dyspnea: Secondary | ICD-10-CM

## 2017-02-12 DIAGNOSIS — J9611 Chronic respiratory failure with hypoxia: Secondary | ICD-10-CM

## 2017-02-12 DIAGNOSIS — I10 Essential (primary) hypertension: Secondary | ICD-10-CM | POA: Diagnosis not present

## 2017-02-12 LAB — CBC WITH DIFFERENTIAL/PLATELET
BASOS ABS: 0.1 10*3/uL (ref 0.0–0.1)
Basophils Relative: 1.4 % (ref 0.0–3.0)
EOS ABS: 0.2 10*3/uL (ref 0.0–0.7)
Eosinophils Relative: 2.3 % (ref 0.0–5.0)
HCT: 45.6 % (ref 36.0–46.0)
Hemoglobin: 14.8 g/dL (ref 12.0–15.0)
LYMPHS ABS: 2.9 10*3/uL (ref 0.7–4.0)
Lymphocytes Relative: 36 % (ref 12.0–46.0)
MCHC: 32.5 g/dL (ref 30.0–36.0)
MCV: 87.3 fl (ref 78.0–100.0)
Monocytes Absolute: 0.9 10*3/uL (ref 0.1–1.0)
Monocytes Relative: 11.2 % (ref 3.0–12.0)
NEUTROS ABS: 4 10*3/uL (ref 1.4–7.7)
NEUTROS PCT: 49.1 % (ref 43.0–77.0)
PLATELETS: 241 10*3/uL (ref 150.0–400.0)
RBC: 5.22 Mil/uL — ABNORMAL HIGH (ref 3.87–5.11)
RDW: 16.1 % — ABNORMAL HIGH (ref 11.5–15.5)
WBC: 8.1 10*3/uL (ref 4.0–10.5)

## 2017-02-12 LAB — BASIC METABOLIC PANEL
BUN: 7 mg/dL (ref 6–23)
CHLORIDE: 93 meq/L — AB (ref 96–112)
CO2: 41 mEq/L — ABNORMAL HIGH (ref 19–32)
CREATININE: 0.78 mg/dL (ref 0.40–1.20)
Calcium: 9.7 mg/dL (ref 8.4–10.5)
GFR: 94.1 mL/min (ref >60.00)
Glucose, Bld: 95 mg/dL (ref 70–99)
Potassium: 3.2 mEq/L — ABNORMAL LOW (ref 3.5–5.1)
Sodium: 141 mEq/L (ref 135–145)

## 2017-02-12 LAB — TSH: TSH: 0.33 u[IU]/mL — ABNORMAL LOW (ref 0.35–4.50)

## 2017-02-12 LAB — BRAIN NATRIURETIC PEPTIDE: Pro B Natriuretic peptide (BNP): 81 pg/mL (ref 0.0–100.0)

## 2017-02-12 LAB — SEDIMENTATION RATE: SED RATE: 31 mm/h — AB (ref 0–30)

## 2017-02-12 MED ORDER — AMOXICILLIN-POT CLAVULANATE 875-125 MG PO TABS: 1.0000 | ORAL_TABLET | Freq: Two times a day (BID) | ORAL | 0 refills | Status: AC

## 2017-02-12 MED ORDER — BISOPROLOL FUMARATE 5 MG PO TABS: 5.0000 mg | ORAL_TABLET | Freq: Every day | ORAL | 11 refills | Status: DC

## 2017-02-12 MED ORDER — BUDESONIDE-FORMOTEROL FUMARATE 80-4.5 MCG/ACT IN AERO: 2.0000 | INHALATION_SPRAY | Freq: Two times a day (BID) | RESPIRATORY_TRACT | 11 refills | Status: DC

## 2017-02-12 MED ORDER — BUDESONIDE-FORMOTEROL FUMARATE 80-4.5 MCG/ACT IN AERO: 2.0000 | INHALATION_SPRAY | Freq: Two times a day (BID) | RESPIRATORY_TRACT | 0 refills | Status: DC

## 2017-02-12 NOTE — Progress Notes (Signed)
Subjective:     Patient ID: Roberta Torres, female   DOB: 18-Dec-1945,    MRN: [id number]  HPI  78 yobf  Quit smoking 1990 eval by Dr Joya Gaskins in 2013 with [place] related to Left heart pressures > rec sleep eval (Chodri) did not try once due to "only size machine they had didn't fit in her bedroom"  referred back to pulmonary clinic 02/12/2017 by Dr   Agustin Cree for pulmonary nodules ? Sarcoid - Dr Joya Gaskins documented nodules since ? 2009 and high PCWP on w/u for [facility] in last note 2013     02/12/2017 1st [facility] office visit/ Ahmari Duerson   Chief Complaint  Patient presents with  . Pulmonary Consult    Referred by Dr. Jenne Campus for eval of pulmonary nodules. Pt states that she has had a "cold" for 2 yrs- c/o prod cough with [place] to yellow sputum.    admit to [facility] x 3 x last 2  Years with severe cough  100%  Better each time X 2 weeks(denies getting steroids, just abx0 then gradually worse - has happened multiple times in the same period with outpt rx the most recent being 4 months prior to OV by Dr Daine Gip and gradually worse cough again since them  no change day vs noct, some  better with hfa    No obvious day to day or daytime variability or assoc   mucus plugs or hemoptysis or cp or chest tightness, subjective wheeze or overt sinus or hb symptoms. No unusual exp hx or h/o childhood pna/ asthma or knowledge of premature birth.  Sleeping ok flat without nocturnal  or early am exacerbation  of respiratory  c/o's or need for noct saba. Also denies any obvious fluctuation of symptoms with weather or environmental changes or other aggravating or alleviating factors except as outlined above   Current Allergies, Complete Past Medical History, Past Surgical History, Family History, and Social History were reviewed in [employer] record.  ROS  The following are not active complaints unless bolded Hoarseness, sore throat, dysphagia, dental problems, itching, sneezing,  nasal  congestion or discharge of excess mucus or purulent secretions, ear ache,   fever, chills, sweats, unintended wt loss or wt gain, classically pleuritic or exertional cp,  orthopnea pnd or leg swelling, presyncope, palpitations, abdominal pain, anorexia, nausea, vomiting, diarrhea  or change in bowel habits or change in bladder habits, change in stools or change in urine, dysuria, hematuria,  rash, arthralgias, visual complaints, headache, numbness, weakness or ataxia or problems with walking or coordination,  change in mood/affect or memory.        Current Meds  Medication Sig  . citalopram (CELEXA) 20 MG tablet Take 20 mg by mouth daily.  . furosemide (LASIX) 80 MG tablet Take 80 mg by mouth 2 (two) times daily.   . hydrALAZINE (APRESOLINE) 100 MG tablet Take 1 tablet (100 mg total) by mouth 3 (three) times daily.  . potassium chloride SA (K-DUR,KLOR-CON) 20 MEQ tablet Take 20 mEq by mouth 2 (two) times daily.   . promethazine (PHENERGAN) 25 MG tablet Take 25 mg by mouth every 8 (eight) hours as needed for nausea or vomiting.           Review of Systems     Objective:   Physical Exam   amb bf nad   Wt Readings from Last 3 Encounters:  02/12/17 181 lb (82.1 kg)  01/26/17 181 lb 14.4 oz (82.5 kg)  12/12/16 186  lb 12.8 oz (84.7 kg)    Vital signs reviewed  - Note on arrival 02 sats  95% on RA      HEENT: nl dentition,  and oropharynx. Nl external ear canals without cough reflex - moderate bilateral non-specific turbinate edema     NECK :  without JVD/Nodes/TM/ nl carotid upstrokes bilaterally   LUNGS: no acc muscle use,  Nl contour chest with minimal insp and exp rhonchi sym bilaterally     CV:  RRR  no s3 or murmur or increase in P2, and no edema   ABD:  soft and nontender with nl inspiratory excursion in the supine position. No bruits or organomegaly appreciated, bowel sounds nl  MS:  Nl gait/ ext warm without deformities, calf tenderness, cyanosis or clubbing No  obvious joint restrictions   SKIN: warm and dry without lesions    NEURO:  alert, approp, nl sensorium with  no motor or cerebellar deficits apparent.     CXR PA and Lateral:   02/12/2017 :    I personally reviewed images and agree with radiology impression as follows:    No active cardiopulmonary disease. Stable nodularity in the peripheral right upper lobe.     I personally reviewed images and agree with radiology impression as follows:   Chest CTa  12/15/16  1.  No demonstrable pulmonary embolus.  2. 1.4 x 1.3 cm nodular opacity in the posterior segment right lower lobe, not present previously. This lesion must be viewed with concern for potential neoplasm. This finding may well warrant PET-CT to assess for abnormal metabolic activity.  3. Areas of scattered scarring and fibrosis which may be due to known sarcoidosis. Stable 8 x 5 mm nodular opacity posterior segment right upper lobe, a finding potentially due to scarring. These findings tend to be more prominent in the upper lobes than elsewhere, the typical distribution for sarcoidosis.  4. Areas of mild lymph node enlargement. Lymph node enlargement of this nature may be seen with sarcoidosis but potentially could be indicative of metastatic neoplasm. The stability of this lymph node prominence since prior study suggests nonneoplastic etiology.  5. Apparent small left adrenal adenoma, also present previously. Particular attention this area on PET study would be warranted to exclude the less likely possibility of a small metastasis which has less than expected attenuation value for metastasis.  6. Foci of atherosclerotic and great vessel calcification. Foci of coronary artery calcification noted.  7. Prominence of the main pulmonary outflow tract, a finding felt to be indicative of a degree of underlying pulmonary arterial hypertension.  Labs ordered/ reviewed:      Chemistry      Component Value  Date/Time   NA 141 02/12/2017 1159   NA 137 01/26/2017 1125   K 3.2 (L) 02/12/2017 1159   CL 93 (L) 02/12/2017 1159   CO2 41 (H) 02/12/2017 1159   BUN 7 02/12/2017 1159   BUN 6 (L) 01/26/2017 1125   CREATININE 0.78 02/12/2017 1159      Component Value Date/Time   CALCIUM 9.7 02/12/2017 1159   ALKPHOS 81 02/13/2012 1226   AST 18 02/13/2012 1226   ALT 13 02/13/2012 1226   BILITOT 0.3 02/13/2012 1226        Lab Results  Component Value Date   WBC 8.1 02/12/2017   HGB 14.8 02/12/2017   HCT 45.6 02/12/2017   MCV 87.3 02/12/2017   PLT 241.0 02/12/2017      Lab Results  Component Value Date  TSH 0.33 (L) 02/12/2017     Lab Results  Component Value Date   PROBNP 81.0 02/12/2017       Lab Results  Component Value Date   ESRSEDRATE 31 (H) 02/12/2017   ESRSEDRATE 32 (H) 02/13/2012          Assessment:

## 2017-02-12 NOTE — Telephone Encounter (Signed)
Spoke with [employer]. Advised that the patient was seen today by MW and was advised to stop taking the Coreg and Start the [place]. She just wanted to make sure since the Coreg was prescribed by another doctor.   She verbalized understanding. Nothing else needed at time of call.

## 2017-02-12 NOTE — Patient Instructions (Addendum)
Augmentin 875 mg take one pill twice daily  X 10 days - take at breakfast and supper with large glass of water.  It would help reduce the usual side effects (diarrhea and yeast infections) if you ate cultured yogurt at lunch.   Stop corevidol and start bisoprolol 5 mg daily - can take twice daily if blood pressure too high or palpitations/ high heart rate on this new blood pressure medication   Symbicort 80 Take 2 puffs first thing in am and then another 2 puffs about 12 hours later.   Work on inhaler technique:  relax and gently blow all the way out then take a nice smooth deep breath back in, triggering the inhaler at same time you start breathing in.  Hold for up to 5 seconds if you can. Blow out thru nose. Rinse and gargle with water when done     Please remember to go to the lab and x-ray department downstairs in the basement  for your tests - we will call you with the results when they are available.     Please schedule a follow up office visit in 4 weeks, sooner if needed with pfts on return and bring all active medications/inhalers  - add :: extra Kdur daily for K 3.2 with ? Contraction alkalosis and repeat bmet/ tsh next ov

## 2017-02-13 ENCOUNTER — Encounter: Payer: Self-pay | Admitting: Internal Medicine

## 2017-02-13 DIAGNOSIS — J9611 Chronic respiratory failure with hypoxia: Secondary | ICD-10-CM

## 2017-02-13 DIAGNOSIS — J9612 Chronic respiratory failure with hypercapnia: Secondary | ICD-10-CM

## 2017-02-13 DIAGNOSIS — J45991 Cough variant asthma: Secondary | ICD-10-CM | POA: Insufficient documentation

## 2017-02-13 DIAGNOSIS — J9601 Acute respiratory failure with hypoxia: Secondary | ICD-10-CM | POA: Insufficient documentation

## 2017-02-13 LAB — RESPIRATORY ALLERGY PROFILE REGION II ~~LOC~~
Allergen, Cottonwood, t14: <0.1 kU/L
Allergen, Mulberry, t76: <0.1 kU/L
Aspergillus fumigatus, m3: <0.1 kU/L
Bermuda Grass: <0.1 kU/L
Box Elder IgE: <0.1 kU/L
CLADOSPORIUM HERBARUM (M2) IGE: <0.1 kU/L
CLASS: 0
CLASS: 0
CLASS: 0
CLASS: 0
CLASS: 0
CLASS: 0
CLASS: 0
CLASS: 0
CLASS: 0
CLASS: 0
CLASS: 0
COMMON RAGWEED (SHORT) (W1) IGE: <0.1 kU/L
Class: 0
Class: 0
Class: 0
Class: 0
Class: 0
Class: 0
Class: 0
Class: 0
Class: 0
Class: 0
Class: 0
Class: 0
Class: 0
Dog Dander: <0.1 kU/L
Elm IgE: <0.1 kU/L
IGE (IMMUNOGLOBULIN E), SERUM: 355 kU/L — AB (ref <=114)
Johnson Grass: <0.1 kU/L
Pecan/Hickory Tree IgE: <0.1 kU/L
Rough Pigweed  IgE: <0.1 kU/L
Sheep Sorrel IgE: <0.1 kU/L

## 2017-02-13 LAB — ANGIOTENSIN CONVERTING ENZYME: Angiotensin-Converting Enzyme: 54 U/L (ref 9–67)

## 2017-02-13 LAB — INTERPRETATION:

## 2017-02-13 NOTE — Assessment & Plan Note (Signed)
Strongly prefer in the setting of resp symptoms of unknown origin : Bystolic, the most beta -1  selective Beta blocker available in sample form, with bisoprolol the most selective generic choice  on the market.    Try bisoprolol

## 2017-02-13 NOTE — Assessment & Plan Note (Addendum)
ONO on RA: Desaturation <88%  > 2.5hrs rx  2L QHS RHC 11/2011: moderate pulmonary hypertension with elevated PCWP at 29  PFTs 2013 mild restriction on TLC, moderate restriction on spiro. Moderate reduction in DLCO   DDX of  difficult airways management almost all start with A and  include Adherence, Ace Inhibitors, Acid Reflux, Active Sinus Disease, Alpha 1 Antitripsin deficiency, Anxiety masquerading as Airways dz,  ABPA,  Allergy(esp in young), Aspiration (esp in elderly), Adverse effects of meds,  Active smokers, A bunch of PE's (a small clot burden can't cause this syndrome unless there is already severe underlying pulm or vascular dz with poor reserve) plus two Bs  = Bronchiectasis and Beta blocker use..and one C= CHF   Adherence is always the initial "prime suspect" and is a multilayered concern that requires a "trust but verify" approach in every patient - starting with knowing how to use medications, especially inhalers, correctly, keeping up with refills and understanding the fundamental difference between maintenance and prns vs those medications only taken for a very short course and then stopped and not refilled.  - return with all meds in hand using a trust but verify approach to confirm accurate Medication  Reconciliation The principal here is that until we are certain that the  patients are doing what we've asked, it makes no sense to ask them to do more.  - see hfa teaching  ? Active sinus dz > augmentin then sinus ct if symptoms persist  ? Adverse drug effects > none listed but need to do full med rec next ov   ? Allergy/ asthma > try symb 80 2bid and check allergy profile    ? ABPA > check IgE  ? Anemia/ thyroid dz  >  TSH is a bit low but not likely the issue here, will need f/u  ? BB effects > stop coreg, start bisoprolol (see hbp)    ? CHF > excluded at this point with such a low BNP but may have element of PH    Will re-eval in 4 weeks with full pfts    Total time  devoted to counseling  > 50 % of initial 60 min office visit:  review case with pt/ discussion of options/alternatives/ personally creating written customized instructions  in presence of pt  then going over those specific  Instructions directly with the pt including how to use all of the meds but in particular covering each new medication in detail and the difference between the maintenance= "automatic" meds and the prns using an action plan format for the latter (If this problem/symptom => do that organization reading Left to right).  Please see AVS from this visit for a full list of these instructions which I personally wrote for this pt and  are unique to this visit.

## 2017-02-13 NOTE — Progress Notes (Signed)
ATC, NA and VM not set up yet

## 2017-02-13 NOTE — Assessment & Plan Note (Addendum)
She has longstanding MPN's but this one is new and much larger than past  So unless I can be convinced it's sarcoid related will probably need a navigational bx at some point but for now will focus on treating her symptoms which are not related to it  And obtain angiotensin level with f/u pfts in 4 weeks  Discussed in detail all the  indications, usual  risks and alternatives  relative to the benefits with patient who agrees to proceed with   f/u as outlined.

## 2017-02-13 NOTE — Assessment & Plan Note (Signed)
02/12/2017  After extensive coaching HFA effectiveness =    75% try symb 80 2bid

## 2017-02-13 NOTE — Assessment & Plan Note (Addendum)
2013 ONO on RA: Desaturation <88%  > 2.5hrs rx  2L QHS  HC03 02/12/2017  = 41 with K 3.2  May have element of contraction alkalosis but longstanding noct desats (noted by Dr Joya Gaskins 2013)

## 2017-02-14 NOTE — Progress Notes (Signed)
Spoke with pt and notified of results per Dr. Wert. Pt verbalized understanding and denied any questions. 

## 2017-03-14 ENCOUNTER — Ambulatory Visit: Payer: Medicare HMO | Admitting: Internal Medicine

## 2017-03-28 ENCOUNTER — Ambulatory Visit: Payer: Medicare HMO | Admitting: Cardiology

## 2017-04-13 ENCOUNTER — Ambulatory Visit: Payer: Medicare HMO | Admitting: Internal Medicine

## 2018-02-06 ENCOUNTER — Other Ambulatory Visit: Payer: Self-pay | Admitting: Internal Medicine

## 2018-02-07 ENCOUNTER — Other Ambulatory Visit: Payer: Self-pay | Admitting: Internal Medicine

## 2018-02-28 ENCOUNTER — Ambulatory Visit: Payer: Medicare HMO | Admitting: Cardiology

## 2018-02-28 VITALS — BP 140/74 | HR 68 | Ht 64.0 in | Wt 172.0 lb

## 2018-02-28 DIAGNOSIS — I10 Essential (primary) hypertension: Secondary | ICD-10-CM

## 2018-02-28 DIAGNOSIS — I5032 Chronic diastolic (congestive) heart failure: Secondary | ICD-10-CM

## 2018-02-28 DIAGNOSIS — I270 Primary pulmonary hypertension: Secondary | ICD-10-CM | POA: Diagnosis not present

## 2018-02-28 NOTE — Progress Notes (Signed)
Cardiology Office Note:    Date:  02/28/2018   ID:  [name], DOB [birth year], MRN [id number]  PCP:  Raelyn Number, MD  Cardiologist:  Jenne Campus, MD    Referring MD: Raelyn Number, MD   Chief Complaint  Patient presents with  . Follow-up  I am doing well  History of Present Illness:    Roberta Torres is a 72 y.o. female with diastolic congestive heart failure, essential hypertension also pulmonary hypertension.  She is being seen by pulmonary for some pulmonary nodule.  Overall doing very well still very active he does have some exertional shortness of breath but still trying to push through it and is able to do a lot  Past Medical History:  Diagnosis Date  . Anasarca   . CHF (congestive heart failure) ([place])   . Chronic pain   . Goiter   . Hypertension   . Lumbar arthropathy    Pain clinic  . Multiple lung nodules    worked in Tourist information centre manager 1yrs: retired 2009  . Panic attacks     Past Surgical History:  Procedure Laterality Date  . HERNIA REPAIR  2007  . VAGINAL HYSTERECTOMY      Current Medications: Current Meds  Medication Sig  . budesonide-formoterol (SYMBICORT) 80-4.5 MCG/ACT inhaler Inhale 2 puffs into the lungs 2 (two) times daily.  . citalopram (CELEXA) 20 MG tablet Take 20 mg by mouth daily.  . clonazePAM (KLONOPIN) 0.5 MG tablet Take 0.5 mg by mouth at bedtime.  . ergocalciferol (VITAMIN D2) 1.25 MG (50000 UT) capsule Take 50,000 Units by mouth once a week.  . furosemide (LASIX) 80 MG tablet Take 80 mg by mouth 2 (two) times daily.   . hydrALAZINE (APRESOLINE) 100 MG tablet Take 1 tablet (100 mg total) by mouth 3 (three) times daily.  . potassium chloride SA (K-DUR,KLOR-CON) 20 MEQ tablet Take 20 mEq by mouth 2 (two) times daily.   . promethazine (PHENERGAN) 25 MG tablet Take 25 mg by mouth every 8 (eight) hours as needed for nausea or vomiting.     Allergies:   Moxifloxacin; Prednisone; and Sulfa antibiotics   Social History    Socioeconomic History  . Marital status: Married    Spouse name: Not on file  . Number of children: 3  . Years of education: Not on file  . Highest education level: Not on file  Occupational History  . Occupation: retired    Comment: Banker  Social Needs  . Financial resource strain: Not on file  . Food insecurity:    Worry: Not on file    Inability: Not on file  . Transportation needs:    Medical: Not on file    Non-medical: Not on file  Tobacco Use  . Smoking status: Former Smoker    Packs/day: 0.50    Years: 20.00    Pack years: 10.00    Types: Cigarettes    Last attempt to quit: 04/24/1988    Years since quitting: 29.8  . Smokeless tobacco: Never Used  Substance and Sexual Activity  . Alcohol use: No  . Drug use: No  . Sexual activity: Not on file  Lifestyle  . Physical activity:    Days per week: Not on file    Minutes per session: Not on file  . Stress: Not on file  Relationships  . Social connections:    Talks on phone: Not on file    Gets together: Not on file  Attends religious service: Not on file    Active member of club or organization: Not on file    Attends meetings of clubs or organizations: Not on file    Relationship status: Not on file  Other Topics Concern  . Not on file  Social History Narrative  . Not on file     Family History: The patient's family history includes Cancer in her father and sister; Diabetes Mellitus II in her sister; Diabetes type II in her mother; Heart failure in her mother; Hypertension in her maternal grandfather, maternal grandmother, and mother; Sarcoidosis in her mother; Stomach cancer in her sister. ROS:   Please see the history of present illness.    All 14 point review of systems negative except as described per history of present illness  EKGs/Labs/Other Studies Reviewed:      Recent Labs: No results found for requested labs within last 8760 hours.  Recent Lipid Panel No results found for:  CHOL, TRIG, HDL, CHOLHDL, VLDL, LDLCALC, LDLDIRECT  Physical Exam:    VS:  BP 140/74   Pulse 68   Ht 5\' 4"  (1.626 m)   Wt 172 lb (78 kg)   SpO2 96%   BMI 29.52 kg/m     Wt Readings from Last 3 Encounters:  02/28/18 172 lb (78 kg)  02/12/17 181 lb (82.1 kg)  01/26/17 181 lb 14.4 oz (82.5 kg)     GEN:  Well nourished, well developed in no acute distress HEENT: Normal NECK: No JVD; No carotid bruits LYMPHATICS: No lymphadenopathy CARDIAC: RRR, no murmurs, no rubs, no gallops RESPIRATORY:  Clear to auscultation without rales, wheezing or rhonchi  ABDOMEN: Soft, non-tender, non-distended MUSCULOSKELETAL:  No edema; No deformity  SKIN: Warm and dry LOWER EXTREMITIES: no swelling NEUROLOGIC:  Alert and oriented x 3 PSYCHIATRIC:  Normal affect   ASSESSMENT:    1. Chronic diastolic heart failure, NYHA class 3 ([place])   2. Essential hypertension   3. Pulmonary hypertension, primary ([place])    PLAN:    In order of problems listed above:  1. Chronic diastolic congestive heart failure appears to be compensated I will schedule her to have echocardiogram to reassess this issue. 2. Essential hypertension blood pressure well controlled continue present management. 3. Pulmonary hypertension we will do echocardiogram to reassess the level of pulmonary pressure usual pulmonary pressure between 50 and 55.  Felt to be related to his sleep apnea.  She is being managed by pulmonary.   Medication Adjustments/Labs and Tests Ordered: Current medicines are reviewed at length with the patient today.  Concerns regarding medicines are outlined above.  No orders of the defined types were placed in this encounter.  Medication changes: No orders of the defined types were placed in this encounter.   Signed, Park Liter, MD, [facility] 02/28/2018 10:16 AM    [place]

## 2018-02-28 NOTE — Patient Instructions (Signed)
Medication Instructions:  Your physician recommends that you continue on your current medications as directed. Please refer to the Current Medication list given to you today.  If you need a refill on your cardiac medications before your next appointment, please call your pharmacy.   Lab work: None.   If you have labs (blood work) drawn today and your tests are completely normal, you will receive your results only by: Marland Kitchen MyChart Message (if you have MyChart) OR . A paper copy in the mail If you have any lab test that is abnormal or we need to change your treatment, we will call you to review the results.  Testing/Procedures: None.   Follow-Up: At [facility], you and your health needs are our priority.  As part of our continuing mission to provide you with exceptional heart care, we have created designated Provider Care Teams.  These Care Teams include your primary Cardiologist (physician) and Advanced Practice Providers (APPs -  Physician Assistants and Nurse Practitioners) who all work together to provide you with the care you need, when you need it. You will need a follow up appointment in 6 months.  Please call our office 2 months in advance to schedule this appointment.  You may see No primary care provider on file. or another member of our [employer] Provider Team in [place]: Shirlee More, MD . Jyl Heinz, MD  Any Other Special Instructions Will Be Listed Below (If Applicable).

## 2018-03-05 ENCOUNTER — Encounter: Payer: Self-pay | Admitting: Cardiology

## 2018-05-22 ENCOUNTER — Other Ambulatory Visit: Payer: Self-pay | Admitting: Emergency Medicine

## 2018-05-22 MED ORDER — HYDRALAZINE HCL 100 MG PO TABS: 100.0000 mg | ORAL_TABLET | Freq: Three times a day (TID) | ORAL | 2 refills | Status: DC

## 2018-11-18 ENCOUNTER — Other Ambulatory Visit: Payer: Self-pay | Admitting: Cardiology

## 2018-11-18 NOTE — Telephone Encounter (Signed)
Rx refill sent to pharmacy. 

## 2019-01-13 ENCOUNTER — Other Ambulatory Visit: Payer: Self-pay | Admitting: Specialist

## 2019-01-13 DIAGNOSIS — R918 Other nonspecific abnormal finding of lung field: Secondary | ICD-10-CM

## 2019-01-15 LAB — CYTOLOGY - NON PAP

## 2019-01-25 DIAGNOSIS — J9601 Acute respiratory failure with hypoxia: Secondary | ICD-10-CM | POA: Diagnosis not present

## 2019-01-25 DIAGNOSIS — R0602 Shortness of breath: Secondary | ICD-10-CM | POA: Diagnosis not present

## 2019-01-25 DIAGNOSIS — A419 Sepsis, unspecified organism: Secondary | ICD-10-CM

## 2019-01-25 DIAGNOSIS — C3431 Malignant neoplasm of lower lobe, right bronchus or lung: Secondary | ICD-10-CM

## 2019-01-26 DIAGNOSIS — J9601 Acute respiratory failure with hypoxia: Secondary | ICD-10-CM | POA: Diagnosis not present

## 2019-01-26 DIAGNOSIS — C3431 Malignant neoplasm of lower lobe, right bronchus or lung: Secondary | ICD-10-CM | POA: Diagnosis not present

## 2019-01-26 DIAGNOSIS — A419 Sepsis, unspecified organism: Secondary | ICD-10-CM | POA: Diagnosis not present

## 2019-01-26 DIAGNOSIS — R0602 Shortness of breath: Secondary | ICD-10-CM | POA: Diagnosis not present

## 2019-01-27 DIAGNOSIS — A419 Sepsis, unspecified organism: Secondary | ICD-10-CM | POA: Diagnosis not present

## 2019-01-27 DIAGNOSIS — J9601 Acute respiratory failure with hypoxia: Secondary | ICD-10-CM | POA: Diagnosis not present

## 2019-01-27 DIAGNOSIS — R0602 Shortness of breath: Secondary | ICD-10-CM | POA: Diagnosis not present

## 2019-01-27 DIAGNOSIS — C3431 Malignant neoplasm of lower lobe, right bronchus or lung: Secondary | ICD-10-CM | POA: Diagnosis not present

## 2019-01-28 DIAGNOSIS — A419 Sepsis, unspecified organism: Secondary | ICD-10-CM | POA: Diagnosis not present

## 2019-01-28 DIAGNOSIS — C3431 Malignant neoplasm of lower lobe, right bronchus or lung: Secondary | ICD-10-CM | POA: Diagnosis not present

## 2019-01-28 DIAGNOSIS — R0602 Shortness of breath: Secondary | ICD-10-CM | POA: Diagnosis not present

## 2019-01-28 DIAGNOSIS — J9601 Acute respiratory failure with hypoxia: Secondary | ICD-10-CM | POA: Diagnosis not present

## 2019-01-29 DIAGNOSIS — R0602 Shortness of breath: Secondary | ICD-10-CM | POA: Diagnosis not present

## 2019-01-29 DIAGNOSIS — A419 Sepsis, unspecified organism: Secondary | ICD-10-CM | POA: Diagnosis not present

## 2019-01-29 DIAGNOSIS — J9601 Acute respiratory failure with hypoxia: Secondary | ICD-10-CM | POA: Diagnosis not present

## 2019-01-29 DIAGNOSIS — C3431 Malignant neoplasm of lower lobe, right bronchus or lung: Secondary | ICD-10-CM | POA: Diagnosis not present

## 2019-01-30 ENCOUNTER — Other Ambulatory Visit: Payer: Self-pay

## 2019-01-30 ENCOUNTER — Inpatient Hospital Stay (HOSPITAL_COMMUNITY)
Admission: AD | Admit: 2019-01-30 | Discharge: 2019-02-03 | DRG: 166 | Disposition: A | Discharge status: Home / Self Care | Payer: Medicare HMO | Source: Other Acute Inpatient Hospital | Attending: Internal Medicine | Admitting: Internal Medicine

## 2019-01-30 DIAGNOSIS — D869 Sarcoidosis, unspecified: Secondary | ICD-10-CM | POA: Diagnosis present

## 2019-01-30 DIAGNOSIS — I5032 Chronic diastolic (congestive) heart failure: Secondary | ICD-10-CM | POA: Diagnosis present

## 2019-01-30 DIAGNOSIS — F329 Major depressive disorder, single episode, unspecified: Secondary | ICD-10-CM | POA: Diagnosis present

## 2019-01-30 DIAGNOSIS — Z8249 Family history of ischemic heart disease and other diseases of the circulatory system: Secondary | ICD-10-CM | POA: Diagnosis not present

## 2019-01-30 DIAGNOSIS — J410 Simple chronic bronchitis: Secondary | ICD-10-CM

## 2019-01-30 DIAGNOSIS — F410 Panic disorder [episodic paroxysmal anxiety] without agoraphobia: Secondary | ICD-10-CM | POA: Diagnosis present

## 2019-01-30 DIAGNOSIS — Z79899 Other long term (current) drug therapy: Secondary | ICD-10-CM | POA: Diagnosis not present

## 2019-01-30 DIAGNOSIS — Z87891 Personal history of nicotine dependence: Secondary | ICD-10-CM | POA: Diagnosis not present

## 2019-01-30 DIAGNOSIS — E119 Type 2 diabetes mellitus without complications: Secondary | ICD-10-CM | POA: Diagnosis present

## 2019-01-30 DIAGNOSIS — J449 Chronic obstructive pulmonary disease, unspecified: Secondary | ICD-10-CM | POA: Diagnosis present

## 2019-01-30 DIAGNOSIS — I10 Essential (primary) hypertension: Secondary | ICD-10-CM | POA: Diagnosis present

## 2019-01-30 DIAGNOSIS — J440 Chronic obstructive pulmonary disease with acute lower respiratory infection: Secondary | ICD-10-CM | POA: Diagnosis present

## 2019-01-30 DIAGNOSIS — Z90710 Acquired absence of both cervix and uterus: Secondary | ICD-10-CM

## 2019-01-30 DIAGNOSIS — R042 Hemoptysis: Secondary | ICD-10-CM | POA: Diagnosis present

## 2019-01-30 DIAGNOSIS — C3431 Malignant neoplasm of lower lobe, right bronchus or lung: Secondary | ICD-10-CM | POA: Diagnosis present

## 2019-01-30 DIAGNOSIS — I110 Hypertensive heart disease with heart failure: Secondary | ICD-10-CM | POA: Diagnosis present

## 2019-01-30 DIAGNOSIS — Z7951 Long term (current) use of inhaled steroids: Secondary | ICD-10-CM

## 2019-01-30 DIAGNOSIS — R911 Solitary pulmonary nodule: Secondary | ICD-10-CM

## 2019-01-30 DIAGNOSIS — J9601 Acute respiratory failure with hypoxia: Secondary | ICD-10-CM | POA: Diagnosis present

## 2019-01-30 DIAGNOSIS — G4733 Obstructive sleep apnea (adult) (pediatric): Secondary | ICD-10-CM | POA: Diagnosis present

## 2019-01-30 DIAGNOSIS — Z800 Family history of malignant neoplasm of digestive organs: Secondary | ICD-10-CM

## 2019-01-30 DIAGNOSIS — J189 Pneumonia, unspecified organism: Secondary | ICD-10-CM | POA: Diagnosis present

## 2019-01-30 DIAGNOSIS — R918 Other nonspecific abnormal finding of lung field: Secondary | ICD-10-CM | POA: Diagnosis not present

## 2019-01-30 DIAGNOSIS — I2729 Other secondary pulmonary hypertension: Secondary | ICD-10-CM | POA: Diagnosis present

## 2019-01-30 DIAGNOSIS — R0602 Shortness of breath: Secondary | ICD-10-CM | POA: Diagnosis not present

## 2019-01-30 DIAGNOSIS — A419 Sepsis, unspecified organism: Secondary | ICD-10-CM | POA: Diagnosis not present

## 2019-01-31 ENCOUNTER — Encounter (HOSPITAL_COMMUNITY): Payer: Self-pay | Admitting: Family Medicine

## 2019-01-31 DIAGNOSIS — J189 Pneumonia, unspecified organism: Secondary | ICD-10-CM | POA: Diagnosis present

## 2019-01-31 DIAGNOSIS — J9601 Acute respiratory failure with hypoxia: Secondary | ICD-10-CM

## 2019-01-31 DIAGNOSIS — I10 Essential (primary) hypertension: Secondary | ICD-10-CM

## 2019-01-31 DIAGNOSIS — R918 Other nonspecific abnormal finding of lung field: Secondary | ICD-10-CM

## 2019-01-31 LAB — COMPREHENSIVE METABOLIC PANEL
ALT: 18 U/L (ref 0–44)
AST: 13 U/L — ABNORMAL LOW (ref 15–41)
Albumin: 2.2 g/dL — ABNORMAL LOW (ref 3.5–5.0)
Alkaline Phosphatase: 90 U/L (ref 38–126)
Anion gap: 11 (ref 5–15)
BUN: 9 mg/dL (ref 8–23)
CO2: 30 mmol/L (ref 22–32)
Calcium: 8.4 mg/dL — ABNORMAL LOW (ref 8.9–10.3)
Chloride: 97 mmol/L — ABNORMAL LOW (ref 98–111)
Creatinine, Ser: 0.66 mg/dL (ref 0.44–1.00)
GFR calc Af Amer: >60 mL/min (ref >60)
GFR calc non Af Amer: >60 mL/min (ref >60)
Glucose, Bld: 95 mg/dL (ref 70–99)
Potassium: 3.2 mmol/L — ABNORMAL LOW (ref 3.5–5.1)
Sodium: 138 mmol/L (ref 135–145)
Total Bilirubin: 0.4 mg/dL (ref 0.3–1.2)
Total Protein: 5.9 g/dL — ABNORMAL LOW (ref 6.5–8.1)

## 2019-01-31 LAB — CBC WITH DIFFERENTIAL/PLATELET
Abs Immature Granulocytes: 0.66 10*3/uL — ABNORMAL HIGH (ref 0.00–0.07)
Basophils Absolute: 0.1 10*3/uL (ref 0.0–0.1)
Basophils Relative: 0 %
Eosinophils Absolute: 0.1 10*3/uL (ref 0.0–0.5)
Eosinophils Relative: 0 %
HCT: 29.9 % — ABNORMAL LOW (ref 36.0–46.0)
Hemoglobin: 9.6 g/dL — ABNORMAL LOW (ref 12.0–15.0)
Immature Granulocytes: 3 %
Lymphocytes Relative: 17 %
Lymphs Abs: 3.5 10*3/uL (ref 0.7–4.0)
MCH: 27.2 pg (ref 26.0–34.0)
MCHC: 32.1 g/dL (ref 30.0–36.0)
MCV: 84.7 fL (ref 80.0–100.0)
Monocytes Absolute: 2 10*3/uL — ABNORMAL HIGH (ref 0.1–1.0)
Monocytes Relative: 10 %
Neutro Abs: 14.1 10*3/uL — ABNORMAL HIGH (ref 1.7–7.7)
Neutrophils Relative %: 70 %
Platelets: 551 10*3/uL — ABNORMAL HIGH (ref 150–400)
RBC: 3.53 MIL/uL — ABNORMAL LOW (ref 3.87–5.11)
RDW: 15.9 % — ABNORMAL HIGH (ref 11.5–15.5)
WBC: 20.3 10*3/uL — ABNORMAL HIGH (ref 4.0–10.5)
nRBC: 0 % (ref 0.0–0.2)

## 2019-01-31 LAB — GLUCOSE, CAPILLARY: Glucose-Capillary: 90 mg/dL (ref 70–99)

## 2019-01-31 LAB — STREP PNEUMONIAE URINARY ANTIGEN: Strep Pneumo Urinary Antigen: NEGATIVE

## 2019-01-31 MED ORDER — GUAIFENESIN 200 MG PO TABS: 200.0000 mg | ORAL_TABLET | ORAL | Status: DC | PRN

## 2019-01-31 MED ORDER — ACETAMINOPHEN 325 MG PO TABS: 650.0000 mg | ORAL_TABLET | Freq: Four times a day (QID) | ORAL | Status: DC | PRN

## 2019-01-31 MED ORDER — SODIUM CHLORIDE 0.9% FLUSH: 3.0000 mL | INTRAVENOUS | Status: DC | PRN

## 2019-01-31 MED ORDER — HYDRALAZINE HCL 50 MG PO TABS
100.0000 mg | ORAL_TABLET | Freq: Two times a day (BID) | ORAL | Status: DC
  Administered 2019-01-31 – 2019-02-03 (×7): 100 mg via ORAL
  Filled 2019-01-31 (×7): qty 2

## 2019-01-31 MED ORDER — POTASSIUM CHLORIDE CRYS ER 20 MEQ PO TBCR
40.0000 meq | EXTENDED_RELEASE_TABLET | Freq: Every day | ORAL | Status: DC
  Administered 2019-01-31 – 2019-02-03 (×4): 40 meq via ORAL
  Filled 2019-01-31 (×4): qty 2

## 2019-01-31 MED ORDER — ONDANSETRON HCL 4 MG/2ML IJ SOLN
4.0000 mg | Freq: Four times a day (QID) | INTRAMUSCULAR | Status: DC | PRN
  Administered 2019-01-31: 4 mg via INTRAVENOUS
  Filled 2019-01-31: qty 2

## 2019-01-31 MED ORDER — PANTOPRAZOLE SODIUM 40 MG PO TBEC
40.0000 mg | DELAYED_RELEASE_TABLET | Freq: Every day | ORAL | Status: DC
  Administered 2019-01-31 – 2019-02-03 (×4): 40 mg via ORAL
  Filled 2019-01-31 (×4): qty 1

## 2019-01-31 MED ORDER — HYDROCODONE-ACETAMINOPHEN 5-325 MG PO TABS
1.0000 | ORAL_TABLET | Freq: Four times a day (QID) | ORAL | Status: DC | PRN
  Administered 2019-01-31 – 2019-02-03 (×10): 2 via ORAL
  Filled 2019-01-31 (×4): qty 2
  Filled 2019-01-31: qty 1
  Filled 2019-01-31 (×2): qty 2
  Filled 2019-01-31: qty 1
  Filled 2019-01-31 (×3): qty 2

## 2019-01-31 MED ORDER — MOMETASONE FURO-FORMOTEROL FUM 100-5 MCG/ACT IN AERO
2.0000 | INHALATION_SPRAY | Freq: Two times a day (BID) | RESPIRATORY_TRACT | Status: DC
  Administered 2019-02-01 – 2019-02-02 (×4): 2 via RESPIRATORY_TRACT
  Filled 2019-01-31 (×3): qty 8.8

## 2019-01-31 MED ORDER — PREDNISOLONE 5 MG PO TABS
10.0000 mg | ORAL_TABLET | Freq: Every day | ORAL | Status: DC
  Administered 2019-01-31 – 2019-02-02 (×3): 10 mg via ORAL
  Filled 2019-01-31 (×4): qty 2

## 2019-01-31 MED ORDER — SODIUM CHLORIDE 0.9% FLUSH
3.0000 mL | Freq: Two times a day (BID) | INTRAVENOUS | Status: DC
  Administered 2019-02-02 – 2019-02-03 (×2): 3 mL via INTRAVENOUS

## 2019-01-31 MED ORDER — FUROSEMIDE 40 MG PO TABS
80.0000 mg | ORAL_TABLET | Freq: Every day | ORAL | Status: DC
  Administered 2019-01-31 – 2019-02-03 (×4): 80 mg via ORAL
  Filled 2019-01-31 (×4): qty 2

## 2019-01-31 MED ORDER — BENZONATATE 100 MG PO CAPS
100.0000 mg | ORAL_CAPSULE | Freq: Two times a day (BID) | ORAL | Status: DC | PRN
  Administered 2019-01-31 – 2019-02-02 (×5): 100 mg via ORAL
  Filled 2019-01-31 (×5): qty 1

## 2019-01-31 MED ORDER — CITALOPRAM HYDROBROMIDE 20 MG PO TABS
20.0000 mg | ORAL_TABLET | Freq: Every day | ORAL | Status: DC
  Administered 2019-01-31 – 2019-02-03 (×4): 20 mg via ORAL
  Filled 2019-01-31 (×4): qty 1

## 2019-01-31 MED ORDER — DM-GUAIFENESIN ER 30-600 MG PO TB12
1.0000 | ORAL_TABLET | Freq: Two times a day (BID) | ORAL | Status: DC
  Administered 2019-01-31 – 2019-02-03 (×7): 1 via ORAL
  Filled 2019-01-31 (×7): qty 1

## 2019-01-31 MED ORDER — SODIUM CHLORIDE 0.9 % IV SOLN: 250.0000 mL | INTRAVENOUS | Status: DC | PRN

## 2019-01-31 MED ORDER — PIPERACILLIN-TAZOBACTAM 3.375 G IVPB
3.3750 g | Freq: Three times a day (TID) | INTRAVENOUS | Status: DC
  Administered 2019-01-31 – 2019-02-03 (×11): 3.375 g via INTRAVENOUS
  Filled 2019-01-31 (×12): qty 50

## 2019-01-31 MED ORDER — GUAIFENESIN 100 MG/5ML PO SOLN
5.0000 mL | ORAL | Status: DC | PRN
  Administered 2019-01-31 – 2019-02-02 (×6): 100 mg via ORAL
  Filled 2019-01-31 (×6): qty 10

## 2019-01-31 NOTE — H&P (Signed)
History and Physical    Roberta Torres SWH:[id number] DOB: [birth year] DOA: 01/30/2019  PCP: Bonnita Nasuti, MD   Patient coming from: [facility]   Chief Complaint: Productive cough, SOB, fever   HPI: Roberta Torres is a 73 y.o. female with medical history significant for COPD, chronic diastolic CHF, hypertension, depression, and lung mass, who presented to the [facility] emergency department on 01/25/2019 with 6 days of fevers, productive cough, shortness of breath, and general malaise.  She denied any sick contacts, abdominal pain, diarrhea, vomiting, or dysuria. Sputum has been blood tinged.   ED and [facility] Course: Upon arrival to the ED, patient is found to be hypoxic with leukocytosis, reassuringly normal lactate, and procalcitonin of 0.26.  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  There is also concern for associated pneumonia or lymphangitic spread.  Patient was started on broad-spectrum empiric antibiotics, had negative MRSA screen, negative for COVID-19 on 01/25/2019, completed 5 days of azithromycin, and currently on Zosyn.  Hospitalist caring for the patient at [facility] discussed the case with pulmonology at [facility] who agreed to see the patient in consultation, and transfer was arranged to [facility] long hospital for ongoing evaluation and management.  Review of Systems:  All other systems reviewed and apart from HPI, are negative.  Past Medical History:  Diagnosis Date  . Anasarca   . CHF (congestive heart failure) ([place])   . Chronic pain   . Goiter   . Hypertension   . Lumbar arthropathy    Pain clinic  . Multiple lung nodules    worked in Tourist information centre manager 13yrs: retired 2009  . Panic attacks     Past Surgical History:  Procedure Laterality Date  . HERNIA REPAIR  2007  . VAGINAL HYSTERECTOMY       reports that she quit smoking about 30 years ago. Her  smoking use included cigarettes. She has a 10.00 pack-year smoking history. She has never used smokeless tobacco. She reports that she does not drink alcohol or use drugs.  Allergies  Allergen Reactions  . Moxifloxacin Nausea And Vomiting  . Prednisone     "does thing and does not know she has done them" once taking this med  . Sulfa Antibiotics     Rash, SOB    Family History  Problem Relation Age of Onset  . Heart failure Mother   . Hypertension Mother   . Diabetes type II Mother   . Sarcoidosis Mother   . Hypertension Maternal Grandfather   . Hypertension Maternal Grandmother   . Stomach cancer Sister   . Diabetes Mellitus II Sister   . Cancer Father        mets everywhere  . Cancer Sister        stomach     Prior to Admission medications   Medication Sig Start Date End Date Taking? Authorizing Provider  budesonide-formoterol (SYMBICORT) 80-4.5 MCG/ACT inhaler Inhale 2 puffs into the lungs 2 (two) times daily. 02/12/17  Yes Tanda Rockers, MD  citalopram (CELEXA) 20 MG tablet Take 20 mg by mouth daily.   Yes [provider]  furosemide (LASIX) 80 MG tablet Take 80 mg by mouth daily. Patient states takes a 2nd dose at night, if she needs it.   Yes [provider]  hydrALAZINE (APRESOLINE) 100 MG tablet 1T PO TID Patient taking differently: Take 100 mg by mouth daily as needed (blood  pressure).  11/18/18  Yes Park Liter, MD  potassium chloride SA (K-DUR,KLOR-CON) 20 MEQ tablet Take 40 mEq by mouth daily.    Yes [provider]  promethazine (PHENERGAN) 25 MG tablet Take 25 mg by mouth every 8 (eight) hours as needed for nausea or vomiting.   Yes [provider]  ergocalciferol (VITAMIN D2) 1.25 MG (50000 UT) capsule Take 50,000 Units by mouth once a week.    [provider]    Physical Exam: Vitals:   01/30/19 2143  BP: (!) 145/59  Pulse: 67  Resp: 18  Temp: 98.3 F (36.8 C)  TempSrc: Oral  SpO2: 96%  Weight:  81.6 kg  Height: 5\' 5"  (1.651 m)    Constitutional: NAD, calm  Eyes: PERTLA, lids and conjunctivae normal ENMT: Mucous membranes are moist. Posterior pharynx clear of any exudate or lesions.   Neck: normal, supple, no masses, no thyromegaly Respiratory: Rhonchi on right. Speaking full sentences. No accessory muscle use.  Cardiovascular: S1 & S2 heard, regular rate and rhythm. No extremity edema.  Abdomen: No distension, no tenderness, soft. Bowel sounds normal.  Musculoskeletal: no clubbing / cyanosis. No joint deformity upper and lower extremities.    Skin: no significant rashes, lesions, ulcers. Warm, dry, well-perfused. Neurologic: CN 2-12 grossly intact. Sensation intact. Strength 5/5 in all 4 limbs.  Psychiatric:  Alert and oriented x 3. Very pleasant, cooperative.    Labs on Admission: I have personally reviewed following labs and imaging studies  CBC: No results for input(s): WBC, NEUTROABS, HGB, HCT, MCV, PLT in the last 168 hours. Basic Metabolic Panel: No results for input(s): NA, K, CL, CO2, GLUCOSE, BUN, CREATININE, CALCIUM, MG, PHOS in the last 168 hours. GFR: CrCl cannot be calculated (Patient's most recent lab result is older than the maximum 21 days allowed.). Liver Function Tests: No results for input(s): AST, ALT, ALKPHOS, BILITOT, PROT, ALBUMIN in the last 168 hours. No results for input(s): LIPASE, AMYLASE in the last 168 hours. No results for input(s): AMMONIA in the last 168 hours. Coagulation Profile: No results for input(s): INR, PROTIME in the last 168 hours. Cardiac Enzymes: No results for input(s): CKTOTAL, CKMB, CKMBINDEX, TROPONINI in the last 168 hours. BNP (last 3 results) No results for input(s): PROBNP in the last 8760 hours. HbA1C: No results for input(s): HGBA1C in the last 72 hours. CBG: No results for input(s): GLUCAP in the last 168 hours. Lipid Profile: No results for input(s): CHOL, HDL, LDLCALC, TRIG, CHOLHDL, LDLDIRECT in the last 72  hours. Thyroid Function Tests: No results for input(s): TSH, T4TOTAL, FREET4, T3FREE, THYROIDAB in the last 72 hours. Anemia Panel: No results for input(s): VITAMINB12, FOLATE, FERRITIN, TIBC, IRON, RETICCTPCT in the last 72 hours. Urine analysis: No results found for: COLORURINE, APPEARANCEUR, LABSPEC, PHURINE, GLUCOSEU, HGBUR, BILIRUBINUR, KETONESUR, PROTEINUR, UROBILINOGEN, NITRITE, LEUKOCYTESUR Sepsis Labs: @LABRCNTIP (procalcitonin:4,lacticidven:4) )No results found for this or any previous visit (from the past 240 hour(s)).   Radiological Exams on Admission: No results found.  EKG: Not performed.   Assessment/Plan   1. Lung mass; pneumonia; acute hypoxic respiratory failure; COPD  - Admitted on 10/3 to outside hospital with necrotic RLL lung mass, suspected postobstructive PNA, and new supplemental O2 requirement  - She had been on broad-spectrum empiric abx, had negative MRSA screen, NGTD from blood cultures, completed 5 days of azithromycin, and currently on Zosyn and prednisone taper  - Check strep pneumo and legionella antigens, check sputum culture, consult with pulmonology, continue current antibiotic and supplemental O2  as needed   2. Chronic diastolic CHF  - Appears compensated, continue Lasix    3. Hypertension  - BP at goal, continue hydralazine and Lasix    4. Depression  - Continue Celexa     PPE: mask, face shield  DVT prophylaxis: SCD's  Code Status: Full  Family Communication: Discussed with patient  Consults called: None  Admission status: Inpatient     Vianne Bulls, MD [facility] Pager [phone]  If 7PM-7AM, please contact night-coverage [email or web address] Password [email or web address]  01/31/2019, 1:39 AM

## 2019-01-31 NOTE — Progress Notes (Signed)
PROGRESS NOTE  Roberta Torres  DOB: Mar 09, 1946  PCP: Roberta Nasuti, MD Roberta Torres  DOA: 01/30/2019  LOS: 1 day   Brief narrative: Patient is a 73 year old former smoker with presumed COPD, a history of hypertension and diastolic CHF, secondary pulmonary hypertension, chronic pain and was following up with pulmonary as an outpatient for pulmonary nodal disease.. She presented to [facility] on [facility]3 with cough, dyspnea, malaise.  She was found to be hypoxemic and and elevated WBC count.   CT scan of chest showed right lower lobe multisegmental consolidation with the rounded mass now 7 cm and surrounded by infiltrate.  There is an area of central clearing consistent with either mass or lung necrosis.   She was treated with broad-spectrum antibiotics.  Per discussion with pulmonologist, patient was transferred here to was long for possible bronchoscopy.   Subjective: Patient was seen and examined this morning.  [name].  Lying down in bed.  On oxygen supplementation.  Family at bedside.  Assessment/Plan: Right lower lobe lung mass  Postobstructive pneumonia  Acute respiratory with hypoxia  COPD  -Currently on IV Zosyn.   -Pulmonary consult appreciated.  Tentative plan of bronchoscopy on Monday.  -Continue oxygen supplementation.  Wean down as tolerated.   -Continue bronchodilators -Trend WBC count and temperature.  Cardiovascular issues: chronic diastolic CHF / HTN -Currently not on CHF exacerbation. Blood pressure controlled.   -Home meds include Lasix, hydralazine, potassium supplementation.   -Continue home meds and potassium supplementation.  Depression - Continue Celexa    Mobility: Encourage ambulation DVT prophylaxis:  SCDs Code Status:   Code Status: Full Code  Family Communication:  Family at bedside Expected Discharge:  Pending bronchoscopy on Monday.  Continue IV antibiotics  Consultants:  Pulmonology  Procedures:  None   Antimicrobials: Anti-infectives (From admission, onward)   Start     Dose/Rate Route Frequency Ordered Stop   01/31/19 0200  piperacillin-tazobactam (ZOSYN) IVPB 3.375 g     3.375 g 12.5 mL/hr over 240 Minutes Intravenous Every 8 hours 01/31/19 0149        Diet Order            Diet Heart Room service appropriate? Yes with Assist; Fluid consistency: Thin  Diet effective now              Infusions:  . sodium chloride    . piperacillin-tazobactam (ZOSYN)  IV Stopped (01/31/19 0622)    Scheduled Meds: . citalopram  20 mg Oral Daily  . dextromethorphan-guaiFENesin  1 tablet Oral BID  . furosemide  80 mg Oral Daily  . hydrALAZINE  100 mg Oral Q12H  . mometasone-formoterol  2 puff Inhalation BID  . pantoprazole  40 mg Oral Daily  . potassium chloride SA  40 mEq Oral Daily  . prednisoLONE  10 mg Oral Daily  . sodium chloride flush  3 mL Intravenous Q12H    PRN meds: sodium chloride, acetaminophen, benzonatate, guaiFENesin, HYDROcodone-acetaminophen, ondansetron (ZOFRAN) IV, sodium chloride flush   Objective: Vitals:   01/30/19 2143 01/31/19 0551  BP: (!) 145/59 (!) 167/61  Pulse: 67 79  Resp: 18 18  Temp: 98.3 F (36.8 C) 98.6 F (37 C)  SpO2: 96% 93%    Intake/Output Summary (Last 24 hours) at 01/31/2019 1215 Last data filed at 01/31/2019 0635 Gross per 24 hour  Intake 50 ml  Output 600 ml  Net -550 ml   Filed Weights   01/30/19 2143  Weight: 81.6 kg   Weight change:  Body mass index is 29.94 kg/m.   Physical Exam: General exam: Appears calm and comfortable.  Skin: No rashes, lesions or ulcers. HEENT: Atraumatic, normocephalic, supple neck, no obvious bleeding Lungs: Clear to auscultate bilaterally, diminished air entry in bases CVS: Regular rate and rhythm, no murmur GI/Abd soft, nontender, nondistended, bowel sound present CNS: Alert, awake, able to answer simple questions  psychiatry: Mood appropriate Extremities: No pedal edema, no calf  tenderness  Data Review: I have personally reviewed the laboratory data and studies available.  Recent Labs  Lab 01/31/19 0205  WBC 20.3*  NEUTROABS 14.1*  HGB 9.6*  HCT 29.9*  MCV 84.7  PLT 551*   Recent Labs  Lab 01/31/19 0205  NA 138  K 3.2*  CL 97*  CO2 30  GLUCOSE 95  BUN 9  CREATININE 0.66  CALCIUM 8.4*    Terrilee Croak, MD  [facility] 01/31/2019

## 2019-01-31 NOTE — Progress Notes (Signed)
Pharmacy Antibiotic Note  Roberta Torres is a 73 y.o. female admitted on 01/30/2019 with pneumonia.  Pharmacy has been consulted for zosyn dosing.  Plan: Zosyn 3.375g IV q8h (4 hour infusion).  Height: 5\' 5"  (165.1 cm) Weight: 179 lb 14.3 oz (81.6 kg) IBW/kg (Calculated) : 57  Temp (24hrs), Avg:98.3 F (36.8 C), Min:98.3 F (36.8 C), Max:98.3 F (36.8 C)  Recent Labs  Lab 01/31/19 0205  WBC 20.3*  CREATININE 0.66    Estimated Creatinine Clearance: 68 mL/min (by C-G formula based on SCr of 0.66 mg/dL).    Allergies  Allergen Reactions  . Moxifloxacin Nausea And Vomiting  . Prednisone     "does thing and does not know she has done them" once taking this med  . Sulfa Antibiotics     Rash, SOB    Antimicrobials this admission: Zosyn 01/31/2019 >>   Dose adjustments this admission: -  Microbiology results: -  Thank you for allowing pharmacy to be a part of this patient's care.  Nani Skillern Crowford 01/31/2019 4:08 AM

## 2019-01-31 NOTE — Consult Note (Signed)
NAME:  Roberta Torres, MRN:  [id number], DOB:  1945/12/12, LOS: 1 ADMISSION DATE:  01/30/2019, CONSULTATION DATE: 01/31/2019 REFERRING MD:  Dr Myna Hidalgo, CHIEF COMPLAINT: Abnormal CT chest  Brief History     History of present illness   73 year old former smoker with presumed COPD, a history of hypertension and diastolic CHF, multifactorial secondary pulmonary hypertension, chronic pain. She has been seen in our office in the past for secondary PAH, pulmonary nodular disease.  Last seen 02/12/2017 at which time a CT chest done 12/15/2016 showed a 1.4 cm right lower lobe posterior segmental nodule.  The neck CT scan I see was done on 12/19/2018 that showed significant enlargement of an irregular rounded posterior right lower lobe mass, now 5 cm.  She underwent a bronchoscopy with Dr. Gardenia Phlegm 01/13/2019 that was nondiagnostic  She was admitted to [facility] on 01/25/2019 with cough dyspnea malaise, found to be hypoxemic with a leukocytosis.  Repeat CT chest done 01/25/2019 showed right lower lobe multisegmental consolidation with the rounded mass now 7 cm and surrounded by infiltrate.  There is an area of central clearing consistent with either mass or lung necrosis.  She was treated with broad-spectrum antibiotics, transfers now to [facility] for further care, strategy for tissue diagnosis.   Past Medical History   has a past medical history of Anasarca, CHF (congestive heart failure) ([place]), Chronic pain, Goiter, Hypertension, Lumbar arthropathy, Multiple lung nodules, and Panic attacks.  Procedures:    Significant Diagnostic Tests:  CT chest 01/25/2019 >> 7 cm rounded right lower lobe opacity surrounded by infiltrate, suspect postobstructive pneumonia.  Some central clearing consistent with either mass or lung necrosis  Micro Data:    Antimicrobials:  Azithromycin 10/3 >> 10/7 Zosyn 10/3 >>   Interim history/subjective:  Complains of pleuritic right chest pain She continues to cough, is  bringing up dark blood in her mucus.  Objective   Blood pressure (!) 167/61, pulse 79, temperature 98.6 F (37 C), temperature source Oral, resp. rate 18, height 5\' 5"  (1.651 m), weight 81.6 kg, SpO2 93 %.        Intake/Output Summary (Last 24 hours) at 01/31/2019 0865 Last data filed at 01/31/2019 7846 Gross per 24 hour  Intake 50 ml  Output 600 ml  Net -550 ml   Filed Weights   01/30/19 2143  Weight: 81.6 kg    Examination: General: Ill-appearing woman, no distress, laying in bed HENT: Oropharynx clear, M4 airway Lungs: Coarse on the right, scattered rhonchi, clear on the left Cardiovascular: Regular, distant, no murmur Abdomen: Soft, nondistended with positive bowel sounds Extremities: No significant edema MSK: Tender to palpation along the lateral right costal margin Neuro: Awake, alert, appropriate, answers questions and follows commands, moves all extremities  Resolved Hospital Problem list     Assessment & Plan:   Right lower lobe mass, enlarging on serial scans and now associated with areas consistent with postobstructive pneumonia.  There is some central clearing that could represent tumor necrosis versus lung abscess formation.  She needs a tissue diagnosis and I favor repeat bronchoscopy under conscious sedation.  I discussed the procedure with the patient and her daughter at bedside and they agree to proceed. -Continue antibiotics through the weekend -Pulmonary hygiene -wean steroids to off -Plan for bronchoscopy 10/12 at 0700, will make her NPO at [place] before, stop anti-coag -Patient states that she needs better pain control for some right pleuritic pain associated with coughing   Labs   CBC: Recent Labs  Lab  01/31/19 0205  WBC 20.3*  NEUTROABS 14.1*  HGB 9.6*  HCT 29.9*  MCV 84.7  PLT 551*    Basic Metabolic Panel: Recent Labs  Lab 01/31/19 0205  NA 138  K 3.2*  CL 97*  CO2 30  GLUCOSE 95  BUN 9  CREATININE 0.66  CALCIUM 8.4*   GFR:  Estimated Creatinine Clearance: 68 mL/min (by C-G formula based on SCr of 0.66 mg/dL). Recent Labs  Lab 01/31/19 0205  WBC 20.3*    Liver Function Tests: Recent Labs  Lab 01/31/19 0205  AST 13*  ALT 18  ALKPHOS 90  BILITOT 0.4  PROT 5.9*  ALBUMIN 2.2*   CBG: Recent Labs  Lab 01/31/19 0718  GLUCAP 90    Review of Systems:   As per HPI  Past Medical History  She,  has a past medical history of Anasarca, CHF (congestive heart failure) ([place]), Chronic pain, Goiter, Hypertension, Lumbar arthropathy, Multiple lung nodules, and Panic attacks.   Surgical History    Past Surgical History:  Procedure Laterality Date  . HERNIA REPAIR  2007  . VAGINAL HYSTERECTOMY       Social History   reports that she quit smoking about 30 years ago. Her smoking use included cigarettes. She has a 10.00 pack-year smoking history. She has never used smokeless tobacco. She reports that she does not drink alcohol or use drugs.   Family History   Her family history includes Cancer in her father and sister; Diabetes Mellitus II in her sister; Diabetes type II in her mother; Heart failure in her mother; Hypertension in her maternal grandfather, maternal grandmother, and mother; Sarcoidosis in her mother; Stomach cancer in her sister.   Allergies Allergies  Allergen Reactions  . Moxifloxacin Nausea And Vomiting  . Prednisone     "does thing and does not know she has done them" once taking this med  . Sulfa Antibiotics     Rash, SOB     Home Medications  Prior to Admission medications   Medication Sig Start Date End Date Taking? Authorizing Provider  budesonide-formoterol (SYMBICORT) 80-4.5 MCG/ACT inhaler Inhale 2 puffs into the lungs 2 (two) times daily. 02/12/17  Yes Tanda Rockers, MD  citalopram (CELEXA) 20 MG tablet Take 20 mg by mouth daily.   Yes [provider]  furosemide (LASIX) 80 MG tablet Take 80 mg by mouth daily. Patient states takes a 2nd dose at night, if she needs  it.   Yes [provider]  hydrALAZINE (APRESOLINE) 100 MG tablet 1T PO TID Patient taking differently: Take 100 mg by mouth daily as needed (blood pressure).  11/18/18  Yes Park Liter, MD  potassium chloride SA (K-DUR,KLOR-CON) 20 MEQ tablet Take 40 mEq by mouth daily.    Yes [provider]  promethazine (PHENERGAN) 25 MG tablet Take 25 mg by mouth every 8 (eight) hours as needed for nausea or vomiting.   Yes [provider]  ergocalciferol (VITAMIN D2) 1.25 MG (50000 UT) capsule Take 50,000 Units by mouth once a week.    [provider]     Baltazar Apo, MD, PhD 01/31/2019, 11:07 AM [facility] [phone] or if no answer [phone]

## 2019-02-01 LAB — CBC WITH DIFFERENTIAL/PLATELET
Abs Immature Granulocytes: 0.7 10*3/uL — ABNORMAL HIGH (ref 0.00–0.07)
Basophils Absolute: 0.1 10*3/uL (ref 0.0–0.1)
Basophils Relative: 1 %
Eosinophils Absolute: 0.1 10*3/uL (ref 0.0–0.5)
Eosinophils Relative: 1 %
HCT: 31.1 % — ABNORMAL LOW (ref 36.0–46.0)
Hemoglobin: 10 g/dL — ABNORMAL LOW (ref 12.0–15.0)
Immature Granulocytes: 4 %
Lymphocytes Relative: 20 %
Lymphs Abs: 3.8 10*3/uL (ref 0.7–4.0)
MCH: 27.4 pg (ref 26.0–34.0)
MCHC: 32.2 g/dL (ref 30.0–36.0)
MCV: 85.2 fL (ref 80.0–100.0)
Monocytes Absolute: 1.8 10*3/uL — ABNORMAL HIGH (ref 0.1–1.0)
Monocytes Relative: 10 %
Neutro Abs: 12.3 10*3/uL — ABNORMAL HIGH (ref 1.7–7.7)
Neutrophils Relative %: 64 %
Platelets: 525 10*3/uL — ABNORMAL HIGH (ref 150–400)
RBC: 3.65 MIL/uL — ABNORMAL LOW (ref 3.87–5.11)
RDW: 16 % — ABNORMAL HIGH (ref 11.5–15.5)
WBC: 18.8 10*3/uL — ABNORMAL HIGH (ref 4.0–10.5)
nRBC: 0 % (ref 0.0–0.2)

## 2019-02-01 LAB — BASIC METABOLIC PANEL
Anion gap: 8 (ref 5–15)
BUN: 7 mg/dL — ABNORMAL LOW (ref 8–23)
CO2: 33 mmol/L — ABNORMAL HIGH (ref 22–32)
Calcium: 8.4 mg/dL — ABNORMAL LOW (ref 8.9–10.3)
Chloride: 97 mmol/L — ABNORMAL LOW (ref 98–111)
Creatinine, Ser: 0.59 mg/dL (ref 0.44–1.00)
GFR calc Af Amer: >60 mL/min (ref >60)
GFR calc non Af Amer: >60 mL/min (ref >60)
Glucose, Bld: 98 mg/dL (ref 70–99)
Potassium: 3.4 mmol/L — ABNORMAL LOW (ref 3.5–5.1)
Sodium: 138 mmol/L (ref 135–145)

## 2019-02-01 LAB — MAGNESIUM: Magnesium: 2.3 mg/dL (ref 1.7–2.4)

## 2019-02-01 NOTE — Progress Notes (Signed)
Spoke with [facility] at [facility] regarding pt's covid test, medical release signed by pt and faxed to [facility] at [phone]. Verified negative result. Copy placed in chart.

## 2019-02-01 NOTE — Progress Notes (Signed)
PROGRESS NOTE  Roberta Torres  DOB: [birth year]  PCP: Bonnita Nasuti, MD QIO:[id number]  DOA: 01/30/2019  LOS: 2 days   Brief narrative: Patient is a 73 year old former smoker with presumed COPD, a history of hypertension and diastolic CHF, secondary pulmonary hypertension, chronic pain and was following up with pulmonary as an outpatient for pulmonary nodal disease.. She presented to [facility] on 10/3 with cough, dyspnea, malaise.  She was found to be hypoxemic and and elevated WBC count.   CT scan of chest showed right lower lobe multisegmental consolidation with the rounded mass now 7 cm and surrounded by infiltrate.  There is an area of central clearing consistent with either mass or lung necrosis.   She was treated with broad-spectrum antibiotics.  Per discussion with pulmonologist, patient was transferred here to [facility] for possible bronchoscopy.   Subjective: Patient was seen and examined this morning.  Sitting up in bed.  Not in distress on 2 L cannula.  Assessment/Plan: Right lower lobe lung mass  Postobstructive pneumonia  Acute respiratory with hypoxia  COPD  -Currently on IV Zosyn.   -Pulmonary consult appreciated.  Tentative plan of bronchoscopy on Monday.  -Not oxygen dependent at home.  Currently on 2 L by nasal cannula.  Wean down as tolerated.   -Continue bronchodilators. -WBC count was elevated to 20.3 on admission, slightly down to 18.8 today. -No fever last 24 hours. -Trend WBC count and temperature.  Cardiovascular issues: chronic diastolic CHF / HTN -Currently not on CHF exacerbation. Blood pressure controlled.   -Home meds include Lasix, hydralazine, potassium supplementation.   -Continue home meds and potassium supplementation.  Depression - Continue Celexa    Mobility: Encourage ambulation DVT prophylaxis:  SCDs Code Status:   Code Status: Full Code  Family Communication:   Expected Discharge:  Pending bronchoscopy on Monday.  Continue IV  antibiotics  Consultants:  Pulmonology  Procedures:  None  Antimicrobials: Anti-infectives (From admission, onward)   Start     Dose/Rate Route Frequency Ordered Stop   01/31/19 0200  piperacillin-tazobactam (ZOSYN) IVPB 3.375 g     3.375 g 12.5 mL/hr over 240 Minutes Intravenous Every 8 hours 01/31/19 0149        Diet Order            Diet Heart Room service appropriate? Yes with Assist; Fluid consistency: Thin  Diet effective now              Infusions:  . sodium chloride    . piperacillin-tazobactam (ZOSYN)  IV 12.5 mL/hr at 02/01/19 0800    Scheduled Meds: . citalopram  20 mg Oral Daily  . dextromethorphan-guaiFENesin  1 tablet Oral BID  . furosemide  80 mg Oral Daily  . hydrALAZINE  100 mg Oral Q12H  . mometasone-formoterol  2 puff Inhalation BID  . pantoprazole  40 mg Oral Daily  . potassium chloride SA  40 mEq Oral Daily  . prednisoLONE  10 mg Oral Daily  . sodium chloride flush  3 mL Intravenous Q12H    PRN meds: sodium chloride, acetaminophen, benzonatate, guaiFENesin, HYDROcodone-acetaminophen, ondansetron (ZOFRAN) IV, sodium chloride flush   Objective: Vitals:   01/31/19 2056 02/01/19 0440  BP: (!) 125/53 (!) 134/51  Pulse: 68 73  Resp: 20 20  Temp: 98.5 F (36.9 C) 98.4 F (36.9 C)  SpO2: 98% 94%    Intake/Output Summary (Last 24 hours) at 02/01/2019 0950 Last data filed at 02/01/2019 0800 Gross per 24 hour  Intake 1809.32 ml  Output 3050 ml  Net -1240.68 ml   Filed Weights   01/30/19 2143  Weight: 81.6 kg   Weight change:  Body mass index is 29.94 kg/m.   Physical Exam: General exam: Appears calm and comfortable.  Skin: No rashes, lesions or ulcers. HEENT: Atraumatic, normocephalic, supple neck, no obvious bleeding Lungs: Clear to auscultation bilaterally CVS: Regular rate and rhythm, no murmur GI/Abd soft, nontender, nondistended, bowel sound present CNS: Alert, awake, able to answer simple questions  psychiatry: Mood  appropriate Extremities: No pedal edema, no calf tenderness  Data Review: I have personally reviewed the laboratory data and studies available.  Recent Labs  Lab 01/31/19 0205 02/01/19 0443  WBC 20.3* 18.8*  NEUTROABS 14.1* 12.3*  HGB 9.6* 10.0*  HCT 29.9* 31.1*  MCV 84.7 85.2  PLT 551* 525*   Recent Labs  Lab 01/31/19 0205 02/01/19 0443  NA 138 138  K 3.2* 3.4*  CL 97* 97*  CO2 30 33*  GLUCOSE 95 98  BUN 9 7*  CREATININE 0.66 0.59  CALCIUM 8.4* 8.4*  MG  --  2.3    Terrilee Croak, MD  [facility] 02/01/2019

## 2019-02-01 NOTE — Progress Notes (Signed)
PHARMACY NOTE -  [facility] has been assisting with dosing of Zosyn for postobstructive PNA. Dosage remains stable at Zosyn 3.375gm IV Q8h to be infused over 4hrs  and need for further dosage adjustment appears unlikely at present.    Will sign off at this time.  Please reconsult if a change in clinical status warrants re-evaluation of dosage.  Netta Cedars, PharmD, BCPS 02/01/2019@10 :29 AM

## 2019-02-02 LAB — GLUCOSE, CAPILLARY: Glucose-Capillary: 90 mg/dL (ref 70–99)

## 2019-02-02 LAB — BASIC METABOLIC PANEL
Anion gap: 10 (ref 5–15)
BUN: 7 mg/dL — ABNORMAL LOW (ref 8–23)
CO2: 31 mmol/L (ref 22–32)
Calcium: 8.7 mg/dL — ABNORMAL LOW (ref 8.9–10.3)
Chloride: 98 mmol/L (ref 98–111)
Creatinine, Ser: 0.69 mg/dL (ref 0.44–1.00)
GFR calc Af Amer: >60 mL/min (ref >60)
GFR calc non Af Amer: >60 mL/min (ref >60)
Glucose, Bld: 90 mg/dL (ref 70–99)
Potassium: 3.7 mmol/L (ref 3.5–5.1)
Sodium: 139 mmol/L (ref 135–145)

## 2019-02-02 LAB — CBC WITH DIFFERENTIAL/PLATELET
Abs Immature Granulocytes: 0.83 10*3/uL — ABNORMAL HIGH (ref 0.00–0.07)
Basophils Absolute: 0.1 10*3/uL (ref 0.0–0.1)
Basophils Relative: 1 %
Eosinophils Absolute: 0.1 10*3/uL (ref 0.0–0.5)
Eosinophils Relative: 1 %
HCT: 30.8 % — ABNORMAL LOW (ref 36.0–46.0)
Hemoglobin: 9.9 g/dL — ABNORMAL LOW (ref 12.0–15.0)
Immature Granulocytes: 5 %
Lymphocytes Relative: 18 %
Lymphs Abs: 3.4 10*3/uL (ref 0.7–4.0)
MCH: 27.3 pg (ref 26.0–34.0)
MCHC: 32.1 g/dL (ref 30.0–36.0)
MCV: 85.1 fL (ref 80.0–100.0)
Monocytes Absolute: 1.6 10*3/uL — ABNORMAL HIGH (ref 0.1–1.0)
Monocytes Relative: 9 %
Neutro Abs: 12.5 10*3/uL — ABNORMAL HIGH (ref 1.7–7.7)
Neutrophils Relative %: 66 %
Platelets: 521 10*3/uL — ABNORMAL HIGH (ref 150–400)
RBC: 3.62 MIL/uL — ABNORMAL LOW (ref 3.87–5.11)
RDW: 15.9 % — ABNORMAL HIGH (ref 11.5–15.5)
WBC: 18.5 10*3/uL — ABNORMAL HIGH (ref 4.0–10.5)
nRBC: 0 % (ref 0.0–0.2)

## 2019-02-02 NOTE — Progress Notes (Signed)
NAME:  Roberta Torres, MRN:  [id number], DOB:  [birth year], LOS: 3 ADMISSION DATE:  01/30/2019, CONSULTATION DATE: 01/31/2019 REFERRING MD:  Dr Myna Hidalgo, CHIEF COMPLAINT: Abnormal CT chest  Brief History     History of present illness   73 year old former smoker with presumed COPD, a history of hypertension and diastolic CHF, multifactorial secondary pulmonary hypertension, chronic pain. She has been seen in our office in the past for secondary PAH, pulmonary nodular disease.  Last seen 02/12/2017 at which time a CT chest done 12/15/2016 showed a 1.4 cm right lower lobe posterior segmental nodule.  The neck CT scan I see was done on 12/19/2018 that showed significant enlargement of an irregular rounded posterior right lower lobe mass, now 5 cm.  She underwent a bronchoscopy with Dr. Gardenia Phlegm 01/13/2019 that was nondiagnostic  She was admitted to [facility] on 01/25/2019 with cough dyspnea malaise, found to be hypoxemic with a leukocytosis.  Repeat CT chest done 01/25/2019 showed right lower lobe multisegmental consolidation with the rounded mass now 7 cm and surrounded by infiltrate.  There is an area of central clearing consistent with either mass or lung necrosis.  She was treated with broad-spectrum antibiotics, transfers now to [facility] for further care, strategy for tissue diagnosis.   Past Medical History   has a past medical history of Anasarca, CHF (congestive heart failure) ([place]), Chronic pain, Goiter, Hypertension, Lumbar arthropathy, Multiple lung nodules, and Panic attacks.  Procedures:    Significant Diagnostic Tests:  CT chest 01/25/2019 >> 7 cm rounded right lower lobe opacity surrounded by infiltrate, suspect postobstructive pneumonia.  Some central clearing consistent with either mass or lung necrosis  Micro Data:    Antimicrobials:  Azithromycin 10/3 >> 10/7 Zosyn 10/3 >>   Interim history/subjective:  Chest pain is a little better, using Vicodin as needed.  Still bothersome  when she coughs   Objective   Blood pressure (!) 133/48, pulse 72, temperature 98.4 F (36.9 C), temperature source Oral, resp. rate 17, height 5\' 5"  (1.651 m), weight 81.6 kg, SpO2 97 %.        Intake/Output Summary (Last 24 hours) at 02/02/2019 1010 Last data filed at 02/02/2019 2778 Gross per 24 hour  Intake 533.02 ml  Output 2550 ml  Net -2016.98 ml   Filed Weights   01/30/19 2143  Weight: 81.6 kg    Examination: General: Ill-appearing woman, no distress, laying in bed, more comfortable today HENT: Oropharynx clear, M4 airway Lungs: Coarse on the right, scattered rhonchi, clear on the left Cardiovascular: Regular, distant, no murmur Abdomen: Soft, nondistended with positive bowel sounds Extremities: No significant edema MSK: Tender to palpation along the lateral right costal margin Neuro: Awake, alert, appropriate, answers questions and follows commands, moves all extremities  Resolved Hospital Problem list     Assessment & Plan:   Right lower lobe mass, enlarging on serial scans and now associated with areas consistent with postobstructive pneumonia.  There is some central clearing that could represent tumor necrosis versus lung abscess formation.  Planning for repeat bronchoscopy on 10/12, 7 AM -Continue antibiotics and pulmonary hygiene through the weekend -Would wean corticosteroids to off -We are scheduled for bronchoscopy 10/12.  N.p.o. at midnight except for sips and meds.  She is not on any anticoagulation -It may be reasonable to have our oncology consultants see her as an inpatient before she is discharged in order to facilitate quick follow-up and initiation of therapy with them.  She states that she wants to get  her cancer therapy at the [facility] here in [place]: Recent Labs  Lab 01/31/19 0205 02/01/19 0443 02/02/19 0457  WBC 20.3* 18.8* 18.5*  NEUTROABS 14.1* 12.3* 12.5*  HGB 9.6* 10.0* 9.9*  HCT 29.9* 31.1* 30.8*  MCV 84.7 85.2  85.1  PLT 551* 525* 521*    Basic Metabolic Panel: Recent Labs  Lab 01/31/19 0205 02/01/19 0443 02/02/19 0457  NA 138 138 139  K 3.2* 3.4* 3.7  CL 97* 97* 98  CO2 30 33* 31  GLUCOSE 95 98 90  BUN 9 7* 7*  CREATININE 0.66 0.59 0.69  CALCIUM 8.4* 8.4* 8.7*  MG  --  2.3  --    GFR: Estimated Creatinine Clearance: 68 mL/min (by C-G formula based on SCr of 0.69 mg/dL). Recent Labs  Lab 01/31/19 0205 02/01/19 0443 02/02/19 0457  WBC 20.3* 18.8* 18.5*    Liver Function Tests: Recent Labs  Lab 01/31/19 0205  AST 13*  ALT 18  ALKPHOS 90  BILITOT 0.4  PROT 5.9*  ALBUMIN 2.2*   CBG: Recent Labs  Lab 01/31/19 0718 02/02/19 0736  GLUCAP 90 90    Baltazar Apo, MD, PhD 02/02/2019, 10:10 AM [facility] [phone] or if no answer [phone]

## 2019-02-02 NOTE — H&P (View-Only) (Signed)
NAME:  Roberta Torres, MRN:  [id number], DOB:  1946/03/31, LOS: 3 ADMISSION DATE:  01/30/2019, CONSULTATION DATE: 01/31/2019 REFERRING MD:  Dr Myna Hidalgo, CHIEF COMPLAINT: Abnormal CT chest  Brief History     History of present illness   73 year old former smoker with presumed COPD, a history of hypertension and diastolic CHF, multifactorial secondary pulmonary hypertension, chronic pain. She has been seen in our office in the past for secondary PAH, pulmonary nodular disease.  Last seen 02/12/2017 at which time a CT chest done 12/15/2016 showed a 1.4 cm right lower lobe posterior segmental nodule.  The neck CT scan I see was done on 12/19/2018 that showed significant enlargement of an irregular rounded posterior right lower lobe mass, now 5 cm.  She underwent a bronchoscopy with Dr. Gardenia Phlegm 01/13/2019 that was nondiagnostic  She was admitted to [facility] on 01/25/2019 with cough dyspnea malaise, found to be hypoxemic with a leukocytosis.  Repeat CT chest done 01/25/2019 showed right lower lobe multisegmental consolidation with the rounded mass now 7 cm and surrounded by infiltrate.  There is an area of central clearing consistent with either mass or lung necrosis.  She was treated with broad-spectrum antibiotics, transfers now to [facility] for further care, strategy for tissue diagnosis.   Past Medical History   has a past medical history of Anasarca, CHF (congestive heart failure) ([place]), Chronic pain, Goiter, Hypertension, Lumbar arthropathy, Multiple lung nodules, and Panic attacks.  Procedures:    Significant Diagnostic Tests:  CT chest 01/25/2019 >> 7 cm rounded right lower lobe opacity surrounded by infiltrate, suspect postobstructive pneumonia.  Some central clearing consistent with either mass or lung necrosis  Micro Data:    Antimicrobials:  Azithromycin 10/3 >> 10/7 Zosyn 10/3 >>   Interim history/subjective:  Chest pain is a little better, using Vicodin as needed.  Still bothersome  when she coughs   Objective   Blood pressure (!) 133/48, pulse 72, temperature 98.4 F (36.9 C), temperature source Oral, resp. rate 17, height 5\' 5"  (1.651 m), weight 81.6 kg, SpO2 97 %.        Intake/Output Summary (Last 24 hours) at 02/02/2019 1010 Last data filed at 02/02/2019 7893 Gross per 24 hour  Intake 533.02 ml  Output 2550 ml  Net -2016.98 ml   Filed Weights   01/30/19 2143  Weight: 81.6 kg    Examination: General: Ill-appearing woman, no distress, laying in bed, more comfortable today HENT: Oropharynx clear, M4 airway Lungs: Coarse on the right, scattered rhonchi, clear on the left Cardiovascular: Regular, distant, no murmur Abdomen: Soft, nondistended with positive bowel sounds Extremities: No significant edema MSK: Tender to palpation along the lateral right costal margin Neuro: Awake, alert, appropriate, answers questions and follows commands, moves all extremities  Resolved Hospital Problem list     Assessment & Plan:   Right lower lobe mass, enlarging on serial scans and now associated with areas consistent with postobstructive pneumonia.  There is some central clearing that could represent tumor necrosis versus lung abscess formation.  Planning for repeat bronchoscopy on 10/12, 7 AM -Continue antibiotics and pulmonary hygiene through the weekend -Would wean corticosteroids to off -We are scheduled for bronchoscopy 10/12.  N.p.o. at midnight except for sips and meds.  She is not on any anticoagulation -It may be reasonable to have our oncology consultants see her as an inpatient before she is discharged in order to facilitate quick follow-up and initiation of therapy with them.  She states that she wants to get  her cancer therapy at the [facility] here in [place]: Recent Labs  Lab 01/31/19 0205 02/01/19 0443 02/02/19 0457  WBC 20.3* 18.8* 18.5*  NEUTROABS 14.1* 12.3* 12.5*  HGB 9.6* 10.0* 9.9*  HCT 29.9* 31.1* 30.8*  MCV 84.7 85.2  85.1  PLT 551* 525* 521*    Basic Metabolic Panel: Recent Labs  Lab 01/31/19 0205 02/01/19 0443 02/02/19 0457  NA 138 138 139  K 3.2* 3.4* 3.7  CL 97* 97* 98  CO2 30 33* 31  GLUCOSE 95 98 90  BUN 9 7* 7*  CREATININE 0.66 0.59 0.69  CALCIUM 8.4* 8.4* 8.7*  MG  --  2.3  --    GFR: Estimated Creatinine Clearance: 68 mL/min (by C-G formula based on SCr of 0.69 mg/dL). Recent Labs  Lab 01/31/19 0205 02/01/19 0443 02/02/19 0457  WBC 20.3* 18.8* 18.5*    Liver Function Tests: Recent Labs  Lab 01/31/19 0205  AST 13*  ALT 18  ALKPHOS 90  BILITOT 0.4  PROT 5.9*  ALBUMIN 2.2*   CBG: Recent Labs  Lab 01/31/19 0718 02/02/19 0736  GLUCAP 90 90    Baltazar Apo, MD, PhD 02/02/2019, 10:10 AM [facility] [phone] or if no answer [phone]

## 2019-02-02 NOTE — Progress Notes (Signed)
PROGRESS NOTE  Roberta Torres  DOB: [birth year]  PCP: Bonnita Nasuti, MD KGM:[id number]  DOA: 01/30/2019  LOS: 3 days   Brief narrative: Patient is a 73 year old former smoker with presumed COPD, a history of hypertension and diastolic CHF, secondary pulmonary hypertension, chronic pain and was following up with pulmonary as an outpatient for pulmonary nodal disease.. She presented to [facility] on 10/3 with cough, dyspnea, malaise.  She was found to be hypoxemic and and elevated WBC count.   CT scan of chest showed right lower lobe multisegmental consolidation with the rounded mass now 7 cm and surrounded by infiltrate. There is an area of central clearing consistent with either mass or lung necrosis.   She was treated with broad-spectrum antibiotics. Per discussion with pulmonologist, patient was transferred here to was long for possible bronchoscopy.   Subjective: Patient was seen and examined this morning.  Sitting up in bed.  Not in distress on 2 L via nasal cannula.  Assessment/Plan: Right lower lobe lung mass  Postobstructive pneumonia  Acute respiratory with hypoxia  COPD  -Currently on IV Zosyn.   -Pulmonary consult appreciated.  Tentative plan of bronchoscopy on Monday.  -Not oxygen dependent at home.  Currently on 2 L by nasal cannula.  Wean down as tolerated.   -Continue bronchodilators. -WBC count was elevated to 20.3 on admission, slightly down to 18.5 today.  Remains on prednisone tablet. -No fever last 24 hours. -Trend WBC count and temperature.  Cardiovascular issues: chronic diastolic CHF / HTN -Currently not on CHF exacerbation. Blood pressure controlled.   -Home meds include Lasix, hydralazine, potassium supplementation.   -Continue home meds and potassium supplementation.  Depression - Continue Celexa    Mobility: Encourage ambulation DVT prophylaxis:  SCDs Code Status:   Code Status: Full Code  Family Communication:   Expected Discharge:   Pending bronchoscopy on Monday.  Continue IV antibiotics  Consultants:  Pulmonology  Procedures:  None  Antimicrobials: Anti-infectives (From admission, onward)   Start     Dose/Rate Route Frequency Ordered Stop   01/31/19 0200  piperacillin-tazobactam (ZOSYN) IVPB 3.375 g     3.375 g 12.5 mL/hr over 240 Minutes Intravenous Every 8 hours 01/31/19 0149        Diet Order            Diet Heart Room service appropriate? Yes with Assist; Fluid consistency: Thin  Diet effective now              Infusions:  . sodium chloride    . piperacillin-tazobactam (ZOSYN)  IV 3.375 g (02/02/19 0556)    Scheduled Meds: . citalopram  20 mg Oral Daily  . dextromethorphan-guaiFENesin  1 tablet Oral BID  . furosemide  80 mg Oral Daily  . hydrALAZINE  100 mg Oral Q12H  . mometasone-formoterol  2 puff Inhalation BID  . pantoprazole  40 mg Oral Daily  . potassium chloride SA  40 mEq Oral Daily  . prednisoLONE  10 mg Oral Daily  . sodium chloride flush  3 mL Intravenous Q12H    PRN meds: sodium chloride, acetaminophen, benzonatate, guaiFENesin, HYDROcodone-acetaminophen, ondansetron (ZOFRAN) IV, sodium chloride flush   Objective: Vitals:   02/02/19 0832 02/02/19 0922  BP:  (!) 133/48  Pulse:    Resp:    Temp:    SpO2: 97%     Intake/Output Summary (Last 24 hours) at 02/02/2019 1132 Last data filed at 02/02/2019 0934 Gross per 24 hour  Intake 733.02 ml  Output 2550  ml  Net -1816.98 ml   Filed Weights   01/30/19 2143  Weight: 81.6 kg   Weight change:  Body mass index is 29.94 kg/m.   Physical Exam: General exam: Appears calm and comfortable.  Skin: No rashes, lesions or ulcers. HEENT: Atraumatic, normocephalic, supple neck, no obvious bleeding Lungs: Clear to auscultation bilaterally CVS: Regular rate and rhythm, no murmur GI/Abd soft, nontender, nondistended, bowel sound present CNS: Alert, awake, able to answer simple questions  psychiatry: Mood appropriate  Extremities: No pedal edema, no calf tenderness  Data Review: I have personally reviewed the laboratory data and studies available.  Recent Labs  Lab 01/31/19 0205 02/01/19 0443 02/02/19 0457  WBC 20.3* 18.8* 18.5*  NEUTROABS 14.1* 12.3* 12.5*  HGB 9.6* 10.0* 9.9*  HCT 29.9* 31.1* 30.8*  MCV 84.7 85.2 85.1  PLT 551* 525* 521*   Recent Labs  Lab 01/31/19 0205 02/01/19 0443 02/02/19 0457  NA 138 138 139  K 3.2* 3.4* 3.7  CL 97* 97* 98  CO2 30 33* 31  GLUCOSE 95 98 90  BUN 9 7* 7*  CREATININE 0.66 0.59 0.69  CALCIUM 8.4* 8.4* 8.7*  MG  --  2.3  --     Terrilee Croak, MD  [facility] 02/02/2019

## 2019-02-03 ENCOUNTER — Ambulatory Visit (HOSPITAL_COMMUNITY): Payer: Medicare HMO | Attending: Internal Medicine

## 2019-02-03 ENCOUNTER — Encounter (HOSPITAL_COMMUNITY)
Admission: AD | Disposition: A | Discharge status: Home / Self Care | Payer: Self-pay | Source: Other Acute Inpatient Hospital | Attending: Internal Medicine

## 2019-02-03 ENCOUNTER — Encounter (HOSPITAL_COMMUNITY): Payer: Self-pay | Admitting: Oncology

## 2019-02-03 ENCOUNTER — Inpatient Hospital Stay (HOSPITAL_COMMUNITY): Payer: Medicare HMO

## 2019-02-03 DIAGNOSIS — R918 Other nonspecific abnormal finding of lung field: Secondary | ICD-10-CM

## 2019-02-03 DIAGNOSIS — I5032 Chronic diastolic (congestive) heart failure: Secondary | ICD-10-CM

## 2019-02-03 DIAGNOSIS — Z87891 Personal history of nicotine dependence: Secondary | ICD-10-CM

## 2019-02-03 DIAGNOSIS — J189 Pneumonia, unspecified organism: Secondary | ICD-10-CM

## 2019-02-03 DIAGNOSIS — Z79899 Other long term (current) drug therapy: Secondary | ICD-10-CM

## 2019-02-03 DIAGNOSIS — J449 Chronic obstructive pulmonary disease, unspecified: Secondary | ICD-10-CM

## 2019-02-03 HISTORY — PX: VIDEO BRONCHOSCOPY: SHX5072

## 2019-02-03 LAB — CBC WITH DIFFERENTIAL/PLATELET
Abs Immature Granulocytes: 0.9 10*3/uL — ABNORMAL HIGH (ref 0.00–0.07)
Basophils Absolute: 0.1 10*3/uL (ref 0.0–0.1)
Basophils Relative: 1 %
Eosinophils Absolute: 0.2 10*3/uL (ref 0.0–0.5)
Eosinophils Relative: 1 %
HCT: 33.2 % — ABNORMAL LOW (ref 36.0–46.0)
Hemoglobin: 10.6 g/dL — ABNORMAL LOW (ref 12.0–15.0)
Immature Granulocytes: 5 %
Lymphocytes Relative: 21 %
Lymphs Abs: 4 10*3/uL (ref 0.7–4.0)
MCH: 27.2 pg (ref 26.0–34.0)
MCHC: 31.9 g/dL (ref 30.0–36.0)
MCV: 85.3 fL (ref 80.0–100.0)
Monocytes Absolute: 1.5 10*3/uL — ABNORMAL HIGH (ref 0.1–1.0)
Monocytes Relative: 8 %
Neutro Abs: 12.1 10*3/uL — ABNORMAL HIGH (ref 1.7–7.7)
Neutrophils Relative %: 64 %
Platelets: 510 10*3/uL — ABNORMAL HIGH (ref 150–400)
RBC: 3.89 MIL/uL (ref 3.87–5.11)
RDW: 16.2 % — ABNORMAL HIGH (ref 11.5–15.5)
WBC: 18.9 10*3/uL — ABNORMAL HIGH (ref 4.0–10.5)
nRBC: 0 % (ref 0.0–0.2)

## 2019-02-03 LAB — GLUCOSE, CAPILLARY: Glucose-Capillary: 87 mg/dL (ref 70–99)

## 2019-02-03 LAB — LEGIONELLA PNEUMOPHILA SEROGP 1 UR AG: L. pneumophila Serogp 1 Ur Ag: NEGATIVE

## 2019-02-03 LAB — BASIC METABOLIC PANEL
Anion gap: 12 (ref 5–15)
BUN: 10 mg/dL (ref 8–23)
CO2: 30 mmol/L (ref 22–32)
Calcium: 8.9 mg/dL (ref 8.9–10.3)
Chloride: 99 mmol/L (ref 98–111)
Creatinine, Ser: 0.82 mg/dL (ref 0.44–1.00)
GFR calc Af Amer: >60 mL/min (ref >60)
GFR calc non Af Amer: >60 mL/min (ref >60)
Glucose, Bld: 101 mg/dL — ABNORMAL HIGH (ref 70–99)
Potassium: 3.9 mmol/L (ref 3.5–5.1)
Sodium: 141 mmol/L (ref 135–145)

## 2019-02-03 SURGERY — BRONCHOSCOPY, WITH FLUOROSCOPY
Anesthesia: Moderate Sedation | Laterality: Bilateral

## 2019-02-03 MED ORDER — FENTANYL CITRATE (PF) 100 MCG/2ML IJ SOLN
INTRAMUSCULAR | Status: DC | PRN
  Administered 2019-02-03: 50 ug via INTRAVENOUS
  Administered 2019-02-03 (×2): 25 ug via INTRAVENOUS
  Administered 2019-02-03: 50 ug via INTRAVENOUS

## 2019-02-03 MED ORDER — FENTANYL CITRATE (PF) 100 MCG/2ML IJ SOLN
INTRAMUSCULAR | Status: AC
  Filled 2019-02-03: qty 4

## 2019-02-03 MED ORDER — SODIUM CHLORIDE (PF) 0.9 % IJ SOLN
INTRAMUSCULAR | Status: AC
  Filled 2019-02-03: qty 50

## 2019-02-03 MED ORDER — IOHEXOL 300 MG/ML  SOLN
100.0000 mL | Freq: Once | INTRAMUSCULAR | Status: AC | PRN
  Administered 2019-02-03: 100 mL via INTRAVENOUS

## 2019-02-03 MED ORDER — BUDESONIDE-FORMOTEROL FUMARATE 80-4.5 MCG/ACT IN AERO: 2.0000 | INHALATION_SPRAY | Freq: Two times a day (BID) | RESPIRATORY_TRACT | 0 refills | Status: AC

## 2019-02-03 MED ORDER — MIDAZOLAM HCL (PF) 5 MG/ML IJ SOLN
INTRAMUSCULAR | Status: AC
  Filled 2019-02-03: qty 2

## 2019-02-03 MED ORDER — AMOXICILLIN-POT CLAVULANATE 875-125 MG PO TABS: 1.0000 | ORAL_TABLET | Freq: Two times a day (BID) | ORAL | 0 refills | Status: AC

## 2019-02-03 MED ORDER — PHENYLEPHRINE HCL 0.25 % NA SOLN
NASAL | Status: DC | PRN
  Administered 2019-02-03: 2 via NASAL

## 2019-02-03 MED ORDER — PHENYLEPHRINE HCL 0.25 % NA SOLN: 1.0000 | Freq: Four times a day (QID) | NASAL | Status: DC | PRN

## 2019-02-03 MED ORDER — MIDAZOLAM HCL (PF) 10 MG/2ML IJ SOLN
INTRAMUSCULAR | Status: DC | PRN
  Administered 2019-02-03: 1 mg via INTRAVENOUS
  Administered 2019-02-03: 2 mg via INTRAVENOUS
  Administered 2019-02-03: 3 mg via INTRAVENOUS

## 2019-02-03 MED ORDER — LACTINEX PO CHEW: 1.0000 | CHEWABLE_TABLET | Freq: Three times a day (TID) | ORAL | 0 refills | Status: AC

## 2019-02-03 MED ORDER — LIDOCAINE HCL URETHRAL/MUCOSAL 2 % EX GEL: 1.0000 "application " | Freq: Once | CUTANEOUS | Status: DC

## 2019-02-03 MED ORDER — IOHEXOL 300 MG/ML  SOLN: 30.0000 mL | Freq: Once | INTRAMUSCULAR | Status: DC | PRN

## 2019-02-03 MED ORDER — BENZONATATE 100 MG PO CAPS: 100.0000 mg | ORAL_CAPSULE | Freq: Two times a day (BID) | ORAL | 0 refills | Status: AC | PRN

## 2019-02-03 MED ORDER — SODIUM CHLORIDE 0.9 % IV SOLN
INTRAVENOUS | Status: DC
  Administered 2019-02-03: 08:00:00 via INTRAVENOUS

## 2019-02-03 NOTE — Consult Note (Signed)
Roberta  [phone] [phone]   MEDICAL ONCOLOGY - INITIAL CONSULTATION  Referral MD: Dr. Terrilee Croak  Reason for Referral: Right lower lobe lung mass  HPI: [name] is a 73 year old female with a past medical history significant for COPD, chronic diastolic heart failure hypertension, depression, recently diagnosed sarcoidosis in 2020, and lung mass.The patient has had pulmonary nodules dating back for several years. She had a new lung mass measuring 1.4 X 1.3 cm in the right lower lobe noted on CT August 2018. She was advised to follow up in 4 weeks, but I do not see that she kept this appointment.  She states that she was unable to follow-up secondary to lack of transportation. The patient initially presented to the [facility] emergency room on 01/25/2019 with 6 days of fevers, productive cough, shortness of breath, and general malaise.  In the emergency room at [facility], she was found to be hypoxic with leukocytosis.  A CT angiogram the chest was negative for PE but demonstrated 6.3 cm necrotic right lower lobe mass compatible with primary bronchogenic neoplasm.  Is also concern for associated pneumonia or lymphangitic spread. She was started on antibiotics. The hospitalist at [facility] spoke with PCCM here at [facility], and the patient was transferred to [facility] for ongoing management. The patient underwent bronchoscopy earlier today. Biopsies are pending.   The patient reports that she underwent what sound like a bronchoscopy in [place], [place] several weeks ago but had complications.  They were unable to obtain tissue.  She developed fever up to 101, anorexia, weight loss of approximately 8 pounds over 1 to 2 weeks, increased cough with hemoptysis, increased shortness of breath, nausea, vomiting, diarrhea and decided to present to the emergency room in [facility] for further evaluation.  Today, the patient reports that her fevers have  resolved.  Appetite is now improving.  She reports headaches but no dizziness.  States headaches have been present for approximately 1 week.  No visual changes.  Denies chest pain.  The patient reports that her shortness of breath is improving.  She still has ongoing hemoptysis but reports that it is not as much.  She also has greenish sputum production.  She reports mild abdominal discomfort particularly over her right upper quadrant.  Nausea, vomiting, diarrhea have resolved.  She reported having some hematuria prior to admission which has now resolved.  She has not noticed any other bleeding.  The patient currently lives in [place], [place].  She is widowed.  She has 3 adult children, 2 live in [place] and one lives in [place].  Denies alcohol use.  Has a history of smoking 1/4 to 1/2 pack of cigarettes per day for approximately 20 years.  She quit in 1990.  States up until a few weeks ago, she was independent in her ADLs and IADLs.  She was able to drive herself.  She has not driven herself over the past 2 weeks because she has felt too fatigued to do so.  Medical oncology was asked see the patient to make recommendations regarding her right lower lobe lung mass.    Past Medical History:  Diagnosis Date  . Anasarca   . CHF (congestive heart failure) ([place])   . Chronic pain   . Goiter   . Hypertension   . Lumbar arthropathy    Pain clinic  . 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 Panic attacks   :  Past Surgical History:  Procedure Laterality Date  . HERNIA REPAIR  2007  . VAGINAL HYSTERECTOMY    :  Current Facility-Administered Medications  Medication Dose Route Frequency Provider Last Rate Last Dose  . 0.9 %  sodium chloride infusion  250 mL Intravenous PRN Opyd, Ilene Qua, MD      . 0.9 %  sodium chloride infusion   Intravenous Continuous Collene Gobble, MD 10 mL/hr at 02/03/19 0731    . acetaminophen (TYLENOL) tablet 650 mg  650 mg Oral  Q6H PRN Collene Gobble, MD      . benzonatate (TESSALON) capsule 100 mg  100 mg Oral BID PRN Collene Gobble, MD   100 mg at 02/02/19 2141  . citalopram (CELEXA) tablet 20 mg  20 mg Oral Daily Collene Gobble, MD   20 mg at 02/02/19 6010  . dextromethorphan-guaiFENesin (MUCINEX DM) 30-600 MG per 12 hr tablet 1 tablet  1 tablet Oral BID Collene Gobble, MD   1 tablet at 02/02/19 2142  . furosemide (LASIX) tablet 80 mg  80 mg Oral Daily Collene Gobble, MD   80 mg at 02/02/19 9323  . guaiFENesin (ROBITUSSIN) 100 MG/5ML solution 100 mg  5 mL Oral Q4H PRN Collene Gobble, MD   100 mg at 02/02/19 2142  . hydrALAZINE (APRESOLINE) tablet 100 mg  100 mg Oral Q12H Collene Gobble, MD   100 mg at 02/02/19 2147  . HYDROcodone-acetaminophen (NORCO/VICODIN) 5-325 MG per tablet 1-2 tablet  1-2 tablet Oral Q6H PRN Collene Gobble, MD   2 tablet at 02/02/19 2144  . iohexol (OMNIPAQUE) 300 MG/ML solution 30 mL  30 mL Oral Once PRN Dahal, Marlowe Aschoff, MD      . mometasone-formoterol (DULERA) 100-5 MCG/ACT inhaler 2 puff  2 puff Inhalation BID Collene Gobble, MD   2 puff at 02/02/19 1952  . ondansetron (ZOFRAN) injection 4 mg  4 mg Intravenous Q6H PRN Collene Gobble, MD   4 mg at 01/31/19 1412  . pantoprazole (PROTONIX) EC tablet 40 mg  40 mg Oral Daily Collene Gobble, MD   40 mg at 02/02/19 5573  . piperacillin-tazobactam (ZOSYN) IVPB 3.375 g  3.375 g Intravenous Q8H Collene Gobble, MD 12.5 mL/hr at 02/03/19 0556 3.375 g at 02/03/19 0556  . potassium chloride SA (KLOR-CON) CR tablet 40 mEq  40 mEq Oral Daily Collene Gobble, MD   40 mEq at 02/02/19 2202  . prednisoLONE tablet 10 mg  10 mg Oral Daily Collene Gobble, MD   10 mg at 02/02/19 5427  . sodium chloride flush (NS) 0.9 % injection 3 mL  3 mL Intravenous Q12H Opyd, Ilene Qua, MD   3 mL at 02/02/19 1000  . sodium chloride flush (NS) 0.9 % injection 3 mL  3 mL Intravenous PRN Opyd, Ilene Qua, MD         Allergies  Allergen Reactions  . Moxifloxacin Nausea  And Vomiting  . Prednisone     "does thing and does not know she has done them" once taking this med  . Sulfa Antibiotics     Rash, SOB  :  Family History  Problem Relation Age of Onset  . Heart failure Mother   . Hypertension Mother   . Diabetes type II Mother   . Sarcoidosis Mother   . Hypertension Maternal Grandfather   . Hypertension Maternal Grandmother   . Stomach cancer Sister   . Diabetes Mellitus II Sister   .  Cancer Father        mets everywhere  . Cancer Sister        stomach  :  Social History   Socioeconomic History  . Marital status: Married    Spouse name: Not on file  . Number of children: 3  . Years of education: Not on file  . Highest education level: Not on file  Occupational History  . Occupation: retired    Comment: Banker  Social Needs  . Financial resource strain: Not on file  . Food insecurity    Worry: Not on file    Inability: Not on file  . Transportation needs    Medical: Not on file    Non-medical: Not on file  Tobacco Use  . Smoking status: Former Smoker    Packs/day: 0.50    Years: 20.00    Pack years: 10.00    Types: Cigarettes    Quit date: 04/24/1988    Years since quitting: 30.8  . Smokeless tobacco: Never Used  Substance and Sexual Activity  . Alcohol use: No  . Drug use: No  . Sexual activity: Not on file  Lifestyle  . Physical activity    Days per week: Not on file    Minutes per session: Not on file  . Stress: Not on file  Relationships  . Social Herbalist on phone: Not on file    Gets together: Not on file    Attends religious service: Not on file    Active member of club or organization: Not on file    Attends meetings of clubs or organizations: Not on file    Relationship status: Not on file  . Intimate partner violence    Fear of current or ex partner: Not on file    Emotionally abused: Not on file    Physically abused: Not on file    Forced sexual activity: Not on file  Other  Topics Concern  . Not on file  Social History Narrative  . Not on file  :  Review of Systems: A comprehensive 14 point review of systems was negative except as noted in the HPI.  Exam: Patient Vitals for the past 24 hrs:  BP Temp Temp src Pulse Resp SpO2  02/03/19 0835 (!) 155/49 - - 80 18 96 %  02/03/19 0830 (!) 156/46 - - 83 17 96 %  02/03/19 0820 (!) 156/45 - - 85 17 100 %  02/03/19 0815 (!) 148/44 - - 87 20 100 %  02/03/19 0810 (!) 154/46 - - 90 19 99 %  02/03/19 0800 (!) 164/56 - - 94 20 99 %  02/03/19 0755 (!) 170/58 - - 96 (!) 24 99 %  02/03/19 0750 (!) 189/88 - - 94 (!) 22 92 %  02/03/19 0745 (!) 204/115 - - (!) 104 20 (!) 82 %  02/03/19 0740 (!) 226/120 - - 99 20 91 %  02/03/19 0735 (!) 151/47 - - 84 20 98 %  02/03/19 0730 (!) 159/51 - - 81 20 97 %  02/03/19 0725 (!) 164/50 - - 93 20 98 %  02/03/19 0720 (!) 155/52 - - 80 20 98 %  02/03/19 0715 (!) 131/44 - - 82 20 98 %  02/03/19 0710 (!) 142/56 - - 84 (!) 22 96 %  02/03/19 0705 (!) 156/45 98.6 F (37 C) Oral 81 20 96 %  02/02/19 2244 (!) 156/45 98.7 F (37.1 C) Oral 79 19 96 %  02/02/19 2148 (!) 146/58 98.9 F (37.2 C) Oral 69 16 97 %  02/02/19 1952 - - - - - 96 %  02/02/19 1459 (!) 136/55 98.9 F (37.2 C) Oral 79 16 98 %    General:  well-nourished in no acute distress.   Eyes:  no scleral icterus.   ENT:  There were no oropharyngeal lesions.   Neck was without thyromegaly.   Lymphatics:  Negative cervical, supraclavicular or axillary adenopathy.   Respiratory: lungs were clear bilaterally without wheezing or crackles.   Cardiovascular:  Regular rate and rhythm, S1/S2, without murmur, rub or gallop.  There was no pedal edema.   GI: Positive bowel sounds, soft, tenderness with palpation over the right upper quadrant.  Unable to assess for hepatomegaly secondary to pain with palpation. Musculoskeletal:  no spinal tenderness of palpation of vertebral spine.   Skin exam was without echymosis, petichae.   Neuro  exam was nonfocal. Patient was alert and oriented.  Attention was good.   Language was appropriate.  Mood was normal without depression.  Speech was not pressured.  Thought content was not tangential.     Lab Results  Component Value Date   WBC 18.9 (H) 02/03/2019   HGB 10.6 (L) 02/03/2019   HCT 33.2 (L) 02/03/2019   PLT 510 (H) 02/03/2019   GLUCOSE 101 (H) 02/03/2019   ALT 18 01/31/2019   AST 13 (L) 01/31/2019   NA 141 02/03/2019   K 3.9 02/03/2019   CL 99 02/03/2019   CREATININE 0.82 02/03/2019   BUN 10 02/03/2019   CO2 30 02/03/2019    No results found.   No results found.  Assessment and Plan:   1.  Right lower lobe lung mass suspicious for lung cancer -Underwent bronchoscopy earlier today and biopsy results are pending. -Recommend completing her staging work-up with a CT of the chest, abdomen, pelvis with contrast and also an MRI of the brain with and without contrast. -I discussed with the patient and her daughter that we need to await the results of the biopsy as well as her staging work-up to make further recommendations regarding treatment for possible lung cancer. -The patient currently lives in [place], [place] but prefers to come to [place] for treatment.  We will arrange for outpatient follow-up at the cancer center after discharge.  2.  COPD -Continue Dulera and prednisone per hospitalist. -Recommend outpatient follow-up with pulmonology for ongoing management  3.  Chronic diastolic congestive heart failure -No acute exacerbation noted. -Continue home meds including Lasix, hydralazine, and potassium supplementation -Recommend outpatient follow-up with cardiology.  Thank you for this referral.   Mikey Bussing, DNP, AGPCNP-BC, AOCNP

## 2019-02-03 NOTE — Op Note (Signed)
[facility] Cardiopulmonary Patient Name: Roberta Torres Procedure Date: 02/03/2019 MRN: [id number] Attending MD: Collene Gobble , MD Date of Birth: 1945-05-05 CSN: [id number] Age: 73 Admit Type: Inpatient Ethnicity: Not Hispanic or Latino Procedure:            Bronchoscopy Indications:          Hemoptysis with abnormal CXR, Right lower lobe lung                        mass suspicious for cancer Providers:            Collene Gobble, MD, Andre Lefort RRT,RCP, Kelby Fam RRT,RCP Referring MD:          Medicines:            Midazolam 6 mg IV, Fentanyl 150 mcg IV, Lidocaine 1%                        applied to cords 9 mL, Lidocaine 1% applied to the                        tracheobronchial tree 12 mL Complications:        Hypertension Estimated Blood Loss: Estimated blood loss was minimal. Procedure:      Pre-Anesthesia Assessment:      - A History and Physical has been performed. Patient meds and allergies       have been reviewed. The risks and benefits of the procedure and the       sedation options and risks were discussed with the patient. All       questions were answered and informed consent was obtained. Patient       identification and proposed procedure were verified prior to the       procedure by the physician in the procedure room. Mental Status       Examination: normal. Airway Examination: normal oropharyngeal airway.       Respiratory Examination: clear to auscultation. CV Examination: RRR, no       murmurs, no S3 or S4. ASA Grade Assessment: II - A patient with mild       systemic disease. After reviewing the risks and benefits, the patient       was deemed in satisfactory condition to undergo the procedure. The       anesthesia plan was to use moderate sedation / analgesia (conscious       sedation). Immediately prior to administration of medications, the       patient was re-assessed for adequacy to receive sedatives. The  heart       rate, respiratory rate, oxygen saturations, blood pressure, adequacy of       pulmonary ventilation, and response to care were monitored throughout       the procedure. The physical status of the patient was re-assessed after       the procedure.      After obtaining informed consent, the bronchoscope was passed under       direct vision. Throughout the procedure, the patient's blood pressure,       pulse, and oxygen saturations were monitored continuously. the BF-H190       ([id number]) Olympus Bronchoscope was introduced through the right nostril  and advanced to the tracheobronchial tree. The patient tolerated the       procedure fairly well. The procedure was accomplished with moderate       difficulty due to the patient's coughing and the patient's       cardiovascular instability (hypertension). Successful completion of the       procedure was aided by increasing the dose of sedation medication. Findings:      The nasopharynx/oropharynx appears normal. The larynx appears normal.       The vocal cords appear normal. The subglottic space is normal. The       trachea is of normal caliber. The carina is sharp. The tracheobronchial       tree of the left lung was examined to at least the first subsegmental       level. Bronchial mucosa and anatomy in the left lung are normal; there       are no endobronchial lesions, and no secretions.      Right Lung Abnormalities: An area of raised, white friable mucosa was       found in the medial basal segment of the right lower lobe (B7). This       endobronchial lesion waspartially obstructing (about 70% obstructed) in       the medial basal segment of the right lower lobe (B7). Endobronchial       biopsies were performed in the medial basal segment of the right lower       lobe using forceps and sent for histopathology examination. Five samples       were obtained. Guided brushings were obtained in the medial basal       segment of  the right lower lobe with a cytology brush and sent for       routine cytology. One sample was obtained. Impression:      - Hemoptysis with abnormal CXR      - Right lower lobe lung mass suspicious for cancer      - The airway examination of the left lung was normal.      - Friable mucosa was found in the medial basal segment of the right       lower lobe (B7).      - White nodular endobronchial lesion was found in the medial basal       segment of the right lower lobe (B7).      - Endobronchial biopsies were performed RLL airway.      - Brushing was obtained RLL airway. Moderate Sedation:      Moderate (conscious) sedation was personally administered by the       pulmonologist. The following parameters were monitored: oxygen       saturation, heart rate, blood pressure, respiratory rate, EKG, adequacy       of pulmonary ventilation, and response to care. Total physician       intraservice time was 29 minutes. Recommendation:      - Await biopsy and cytology results. Procedure Code(s):      --- Professional ---      [phone], Bronchoscopy, rigid or flexible, including fluoroscopic guidance,       when performed; with bronchial or endobronchial biopsy(s), single or       multiple sites      31623, Bronchoscopy, rigid or flexible, including fluoroscopic guidance,       when performed; with brushing or protected brushings      99152, Moderate sedation services provided by the same  physician or       other qualified health care professional performing the diagnostic or       therapeutic service that the sedation supports, requiring the presence       of an independent trained observer to assist in the monitoring of the       patient's level of consciousness and physiological status; initial 15       minutes of intraservice time, patient age 81 years or older      [phone], Moderate sedation; each additional 15 minutes intraservice time Diagnosis Code(s):      --- Professional ---      R04.2,  Hemoptysis      R91.8, Other nonspecific abnormal finding of lung field      J98.4, Other disorders of lung CPT copyright 2019 [employer] Association. All rights reserved. The codes documented in this report are preliminary and upon coder review may  be revised to meet current compliance requirements. Collene Gobble, MD Collene Gobble, MD 02/03/2019 8:12:51 AM Number of Addenda: 0 Scope In: 7:67:20 AM Scope Out: 7:53:27 AM

## 2019-02-03 NOTE — Progress Notes (Signed)
Video Bronchoscope done Intervention Bronchial washing Intervention Bronchial biopsy Procedure tolerated well

## 2019-02-03 NOTE — Discharge Summary (Signed)
Physician Discharge Summary  Roberta Torres SAY:[id number] DOB: [birth year] DOA: 01/30/2019  PCP: Bonnita Nasuti, MD  Admit date: 01/30/2019 Discharge date: 02/03/2019  Admitted From: Home Discharge disposition: Home with home oxygen   Code Status: Full Code  Diet Recommendation: Cardiac diet   Recommendations for Outpatient Follow-Up:   1. Follow-up with oncology as an outpatient  Discharge Diagnosis:   Principal Problem:   Pneumonia Active Problems:   Essential hypertension   Chronic diastolic heart failure, NYHA class 3 (HCC)   Type 2 diabetes mellitus without complications (HCC)   COPD (chronic obstructive pulmonary disease) (HCC)   Obstructive sleep apnea   Sarcoidosis   Acute respiratory failure with hypoxia (HCC)   Lung mass   History of Present Illness / Brief narrative:  Patient is a 73 year old former smoker with presumed COPD, a history of hypertension and diastolic CHF, secondary pulmonary hypertension, chronic pain and was following up with pulmonary as an outpatient for pulmonary nodal disease.. She presented to [facility] on 10/3 with cough, dyspnea, malaise.  She was found to be hypoxemic and and elevated WBC count.   CT scan of chest showed right lower lobe multisegmental consolidation withthe rounded mass now 7 cm and surrounded by infiltrate. There is an area of central clearing consistent with either mass or lung necrosis.  She was treated with broad-spectrum antibiotics. Per discussion with pulmonologist, patient was transferred here to was long for possible bronchoscopy.  10/12, patient underwent fiberoptic bronchoscopy by Dr. Lamonte Sakai.  Sample sent to pathology.  Subjective:  Seen and examined this morning.  Underwent bronchoscopy earlier.  Not in distress. Remains on 2 L oxygen by nasal cannula.  Feels ready to go home.  Hospital Course:  Right lower lobe lung mass -possible lung cancer Postobstructive pneumonia  Acute respiratory with  hypoxia  COPD  -CT scan finding as above.   -Currently improving on IV Zosyn.   -Pulmonary consult appreciated.   -10/12, patient underwent fiberoptic bronchoscopy by Dr. Lamonte Sakai.  Sample sent to pathology -Not oxygen dependent at home.  Currently on 2 L by nasal cannula.  Wean down as tolerated.   -Continue bronchodilators and benzonatate. -WBC count was elevated to 20.3 on admission, slightly down to 18.9 today.  Patient was on prednisone 10 mg daily till yesterday.  I stopped prednisone as per pulmonary recommendation.   -Plan to discharge home on oral Augmentin for next 7 days with probiotics. -I also discussed the case with oncologist Dr. Maylon Peppers this morning. As recommended, CT scan of chest abdomen and pelvis with contrast was obtained which did not show any other focus of suspicious lesion.  MRI brain with contrast was ordered but remains pending.  Discussed with oncologist and patient's family, it can be done as an outpatient as well.  -Patient will follow-up with Dr. Maylon Peppers as an outpatient.  Patient's daughter is not sure if she would be able to continue treatment at [facility] if it is a daily commute.  I also offered her the contact information of oncologist Dr. Hinton Rao at [place] in case she chooses so.  Cardiovascular issues: chronic diastolic CHF/ HTN -Currently not on CHF exacerbation. Blood pressure controlled.   -Home meds include Lasix, hydralazine, potassium supplementation.   -Continue home meds and potassium supplementation.  Depression-Continue Celexa  Discharge Exam:   Vitals:   02/03/19 0954 02/03/19 1238 02/03/19 1417 02/03/19 1455  BP: (!) 135/54 (!) 133/47 (!) 133/47   Pulse: 79 79  83  Resp: 16 17  Temp: 98.2 F (36.8 C) 98.6 F (37 C)    TempSrc: Oral Oral    SpO2: 97% 91%  95%  Weight:      Height:        Body mass index is 29.94 kg/m.  General exam: Appears calm and comfortable.  On oxygen via nasal cannula Skin: No rashes, lesions or ulcers.  HEENT: Atraumatic, normocephalic, supple neck, no obvious bleeding Lungs: Clear to auscultation bilaterally CVS: Regular rate and rhythm, no murmur GI/Abd soft, nontender, nondistended, bowel sound present CNS: Alert, awake and oriented x3 Psychiatry: Mood appropriate Extremities: No pedal edema, no calf tenderness  Discharge Instructions:  Wound care: None Discharge Instructions    For home use only DME oxygen   Complete by: As directed    Length of Need: Lifetime   Mode or (Route): Nasal cannula   Liters per Minute: 2   Frequency: Continuous (stationary and portable oxygen unit needed)   Oxygen delivery system: Gas   Increase activity slowly   Complete by: As directed      Follow-up Information    Tish Men, MD Follow up.   Specialties: Hematology, Oncology Contact information: [place] [address] [phone]        Derwood Kaplan, MD Follow up.   Specialty: Oncology Contact information: [address]. [place] [address] [phone]          Allergies as of 02/03/2019      Reactions   Moxifloxacin Nausea And Vomiting   Prednisone    "does thing and does not know she has done them" once taking this med   Sulfa Antibiotics    Rash, SOB      Medication List    TAKE these medications   amoxicillin-clavulanate 875-125 MG tablet Commonly known as: Augmentin Take 1 tablet by mouth every 12 (twelve) hours for 7 days.   benzonatate 100 MG capsule Commonly known as: TESSALON Take 1 capsule (100 mg total) by mouth 2 (two) times daily as needed for up to 14 days for cough.   budesonide-formoterol 80-4.5 MCG/ACT inhaler Commonly known as: Symbicort Inhale 2 puffs into the lungs 2 (two) times daily.   citalopram 20 MG tablet Commonly known as: CELEXA Take 20 mg by mouth daily.   ergocalciferol 1.25 MG (50000 UT) capsule Commonly known as: VITAMIN D2 Take 50,000 Units by mouth once a week.   furosemide 80 MG tablet  Commonly known as: LASIX Take 80 mg by mouth daily. Patient states takes a 2nd dose at night, if she needs it.   hydrALAZINE 100 MG tablet Commonly known as: APRESOLINE 1T PO TID What changed: See the new instructions.   lactobacillus acidophilus & bulgar chewable tablet Chew 1 tablet by mouth 3 (three) times daily with meals for 7 days.   potassium chloride SA 20 MEQ tablet Commonly known as: KLOR-CON Take 40 mEq by mouth daily.   promethazine 25 MG tablet Commonly known as: PHENERGAN Take 25 mg by mouth every 8 (eight) hours as needed for nausea or vomiting.            Durable Medical Equipment  (From admission, onward)         Start     Ordered   02/03/19 1100  DME Oxygen  Once    Question Answer Comment  Length of Need Lifetime   Oxygen delivery system Gas      02/03/19 1059   02/03/19 0000  For home use only DME  oxygen    Question Answer Comment  Length of Need Lifetime   Mode or (Route) Nasal cannula   Liters per Minute 2   Frequency Continuous (stationary and portable oxygen unit needed)   Oxygen delivery system Gas      02/03/19 1301          Time coordinating discharge: 35 minutes  The results of significant diagnostics from this hospitalization (including imaging, microbiology, ancillary and laboratory) are listed below for reference.    Procedures and Diagnostic Studies:   No results found.   Labs:   Basic Metabolic Panel: Recent Labs  Lab 01/31/19 0205 02/01/19 0443 02/02/19 0457 02/03/19 0506  NA 138 138 139 141  K 3.2* 3.4* 3.7 3.9  CL 97* 97* 98 99  CO2 30 33* 31 30  GLUCOSE 95 98 90 101*  BUN 9 7* 7* 10  CREATININE 0.66 0.59 0.69 0.82  CALCIUM 8.4* 8.4* 8.7* 8.9  MG  --  2.3  --   --    GFR Estimated Creatinine Clearance: 66.4 mL/min (by C-G formula based on SCr of 0.82 mg/dL). Liver Function Tests: Recent Labs  Lab 01/31/19 0205  AST 13*  ALT 18  ALKPHOS 90  BILITOT 0.4  PROT 5.9*  ALBUMIN 2.2*   No results  for input(s): LIPASE, AMYLASE in the last 168 hours. No results for input(s): AMMONIA in the last 168 hours. Coagulation profile No results for input(s): INR, PROTIME in the last 168 hours.  CBC: Recent Labs  Lab 01/31/19 0205 02/01/19 0443 02/02/19 0457 02/03/19 0506  WBC 20.3* 18.8* 18.5* 18.9*  NEUTROABS 14.1* 12.3* 12.5* 12.1*  HGB 9.6* 10.0* 9.9* 10.6*  HCT 29.9* 31.1* 30.8* 33.2*  MCV 84.7 85.2 85.1 85.3  PLT 551* 525* 521* 510*   Cardiac Enzymes: No results for input(s): CKTOTAL, CKMB, CKMBINDEX, TROPONINI in the last 168 hours. BNP: Invalid input(s): POCBNP CBG: Recent Labs  Lab 01/31/19 0718 02/01/19 0743 02/02/19 0736  GLUCAP 90 87 90   D-Dimer No results for input(s): DDIMER in the last 72 hours. Hgb A1c No results for input(s): HGBA1C in the last 72 hours. Lipid Profile No results for input(s): CHOL, HDL, LDLCALC, TRIG, CHOLHDL, LDLDIRECT in the last 72 hours. Thyroid function studies No results for input(s): TSH, T4TOTAL, T3FREE, THYROIDAB in the last 72 hours.  Invalid input(s): FREET3 Anemia work up No results for input(s): VITAMINB12, FOLATE, FERRITIN, TIBC, IRON, RETICCTPCT in the last 72 hours. Microbiology No results found for this or any previous visit (from the past 240 hour(s)).  Please note: You were cared for by a hospitalist during your hospital stay. Once you are discharged, your primary care physician will handle any further medical issues. Please note that NO REFILLS for any discharge medications will be authorized once you are discharged, as it is imperative that you return to your primary care physician (or establish a relationship with a primary care physician if you do not have one) for your post hospital discharge needs so that they can reassess your need for medications and monitor your lab values.  Signed: Terrilee Croak  [facility] 02/03/2019, 4:08 PM

## 2019-02-03 NOTE — TOC Progression Note (Signed)
Transition of Care [facility]) - Progression Note    Patient Details  Name: Roberta Torres MRN: [id number] Date of Birth: 03-13-1946  Transition of [facility]) CM/SW Contact  Joaquin Courts, RN Phone Number: 02/03/2019, 12:00 PM  Clinical Narrative:  [employer] arranged to deliver oxygen.      Expected Discharge Plan: Home/Self Care Barriers to Discharge: No Barriers Identified  Expected Discharge Plan and Services Expected Discharge Plan: Home/Self Care   Discharge Planning Services: CM Consult   Living arrangements for the past 2 months: Single Family Home Expected Discharge Date: 02/03/19               DME Arranged: Oxygen DME Agency: [employer] Date DME Agency Contacted: 02/03/19 Time DME Agency Contacted: [id number] Representative spoke with at DME Agency: zach HH Arranged: NA [place] Agency: NA         Social Determinants of Health (SDOH) Interventions    Readmission Risk Interventions No flowsheet data found.

## 2019-02-03 NOTE — Interval H&P Note (Signed)
PCCM Interval Note  No new issues. Her cough and costal pain are a bit better.  Procedure discussed, all questions answered.   She had some HTN on a previous FOB attempt. We will be prepared to treat if evolves this time.   Plan FOB with endo- or transbronchial bx's  Baltazar Apo, MD, PhD 02/03/2019, 7:13 AM [facility] [phone] or if no answer [phone]

## 2019-02-03 NOTE — Care Management Important Message (Signed)
Important Message  Patient Details IM Letter given to Nancy Marus RN to present to the Patient Name: Roberta Torres MRN: [id number] Date of Birth: July 13, 1945   Medicare Important Message Given:  Yes     Kerin Salen 02/03/2019, 10:23 AM

## 2019-02-03 NOTE — Progress Notes (Signed)
SATURATION QUALIFICATIONS: (This note is used to comply with regulatory documentation for home oxygen)  Patient Saturations on Room Air at Rest = 93%  Patient Saturations on Room Air while Ambulating =92%  Patient Saturations on 2Liters of oxygen while Ambulating = 95%  Please briefly explain why patient needs home oxygen:When Roberta Torres was ambulating on room air she desat to 92% and became very dizzy and stated she felt like she was drunk. She had to sit down for a few minutes and rest. I put her on 2L of oxygen and she went to 95%, and felt fine ambulating.

## 2019-02-04 ENCOUNTER — Telehealth: Payer: Self-pay | Admitting: Emergency Medicine

## 2019-02-04 DIAGNOSIS — C3491 Malignant neoplasm of unspecified part of right bronchus or lung: Secondary | ICD-10-CM

## 2019-02-04 LAB — CYTOLOGY - NON PAP

## 2019-02-04 LAB — SURGICAL PATHOLOGY

## 2019-02-04 NOTE — Telephone Encounter (Signed)
I spoke with Roberta Torres who was discharged from the hospital yesterday following her bronchoscopy.  She is doing well, continues to feel better with her pneumonia treatment.  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  It does not look like she had her MRI brain before she was discharged. I will order  She wants to be seen by Oncology in [place], Dr Maylon Peppers recommended Dr Hinton Rao. I will make that referral also

## 2019-02-06 ENCOUNTER — Encounter (HOSPITAL_COMMUNITY): Payer: Self-pay | Admitting: Emergency Medicine

## 2019-02-13 ENCOUNTER — Other Ambulatory Visit: Payer: Self-pay | Admitting: *Deleted

## 2019-02-13 NOTE — Progress Notes (Signed)
The proposed treatment discussed in cancer conference 02/13/19 is for discussion purpose only and is not a binding recommendation.  The patient was not physically examined nor present for their treatment options.  Therefore, final treatment plans cannot be decided.

## 2019-02-23 DEATH — deceased

## 2021-02-03 IMAGING — CT CT CHEST W/ CM
2 of 5 series · 12 of 36 positions shown, 15 images · IV contrast (OMNIPAQUE)
Comparison: CT chest dated 12/15/2016.

CLINICAL DATA: History of chronic obstructive pulmonary disease,
heart failure, hypertension, sarcoidosis and a right lung mass.
Concern for metastatic disease.

EXAM:
CT CHEST, ABDOMEN, AND PELVIS WITH CONTRAST
TECHNIQUE: Multidetector CT imaging of the chest, abdomen and pelvis was
performed following the standard protocol during bolus
administration of intravenous contrast.
CONTRAST:  100mL OMNIPAQUE IOHEXOL 300 MG/ML  SOLN

[Series 2: cap with · axial · 0.71mm/px · z∈[+103,+598]mm · 9 of 125 slices shown, 12 images]
[im 13/125  mediastinal]
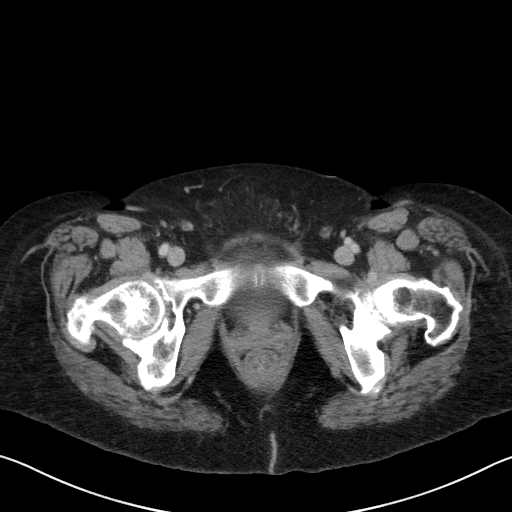
[im 13/125  lung]
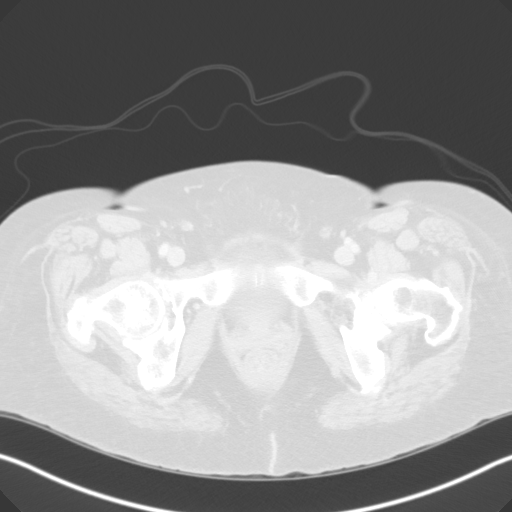
[im 25/125  lung]
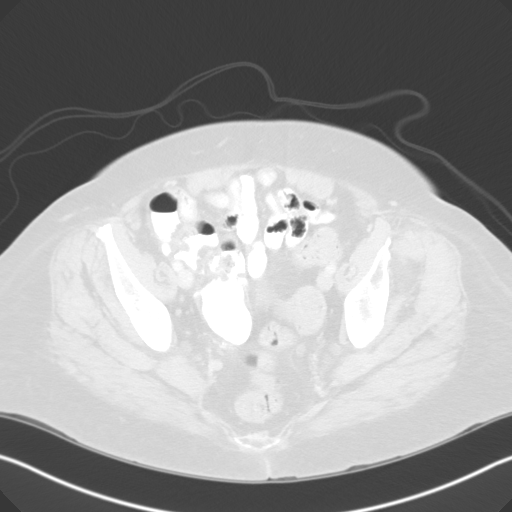
[im 38/125  lung]
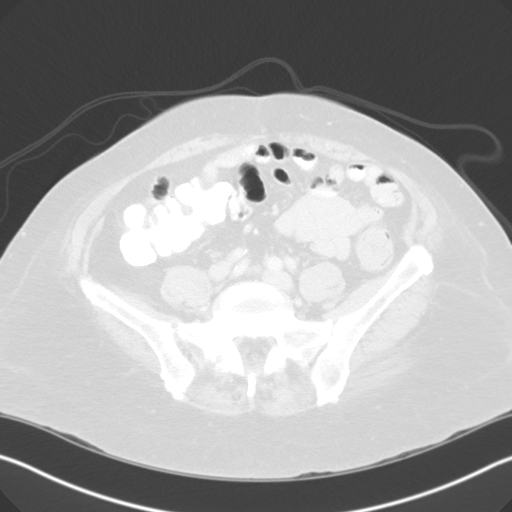
[im 50/125  lung]
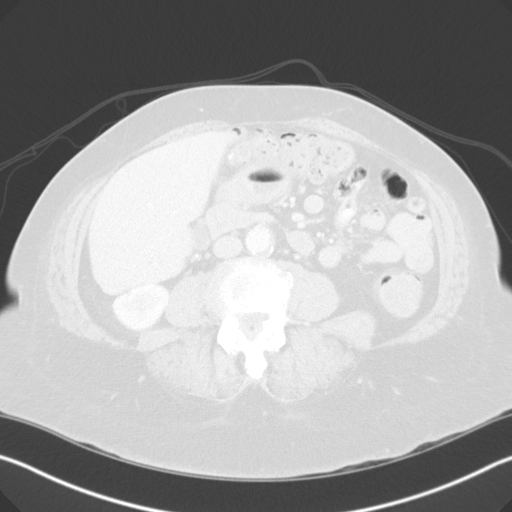
[im 63/125  mediastinal]
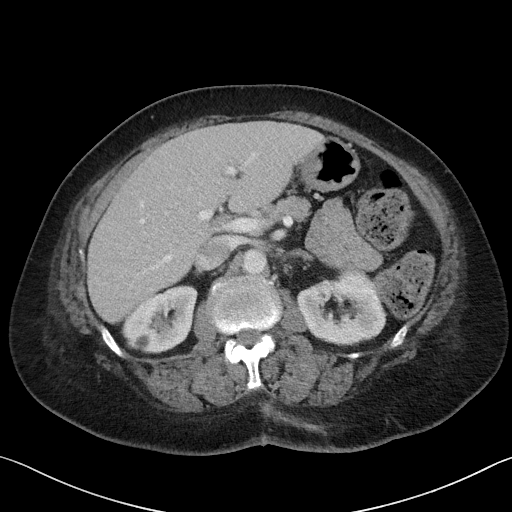
[im 63/125  lung]
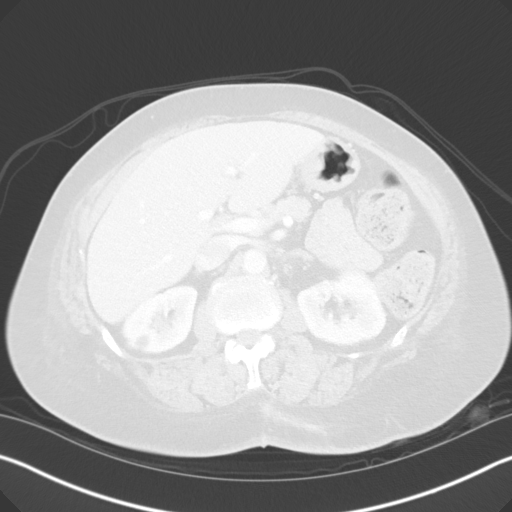
[im 75/125  lung]
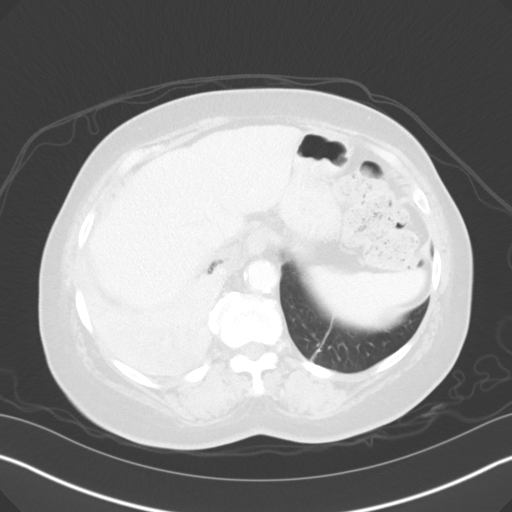
[im 87/125  lung]
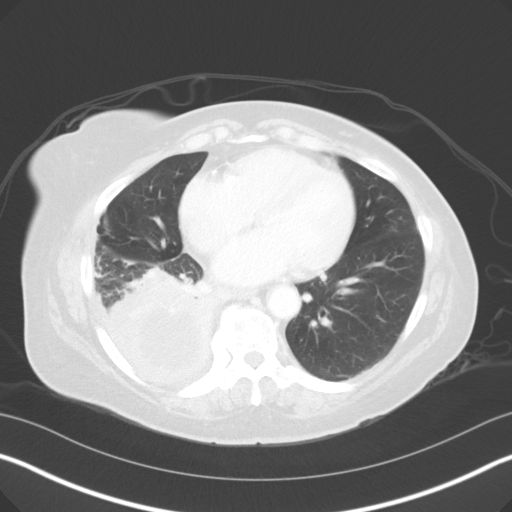
[im 100/125  lung]
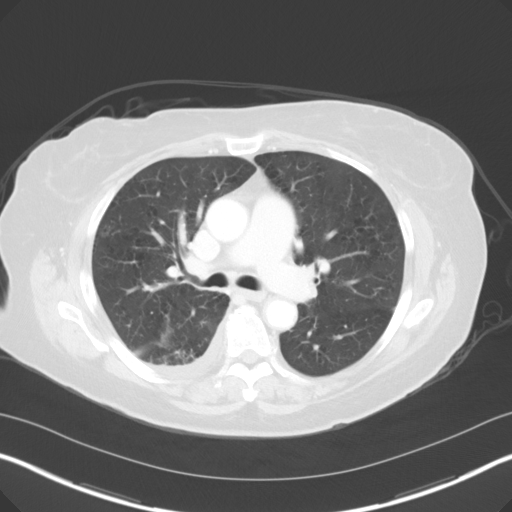
[im 112/125  mediastinal]
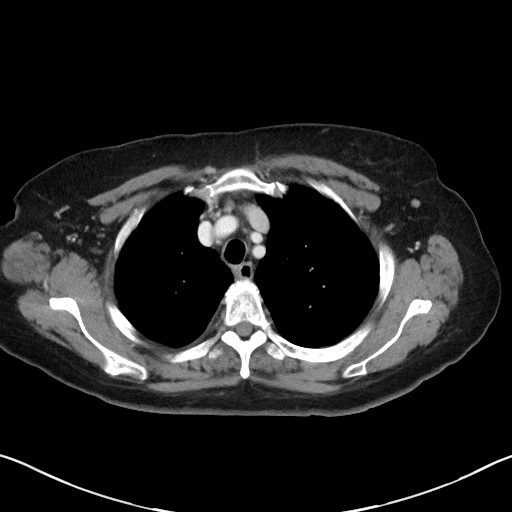
[im 112/125  lung]
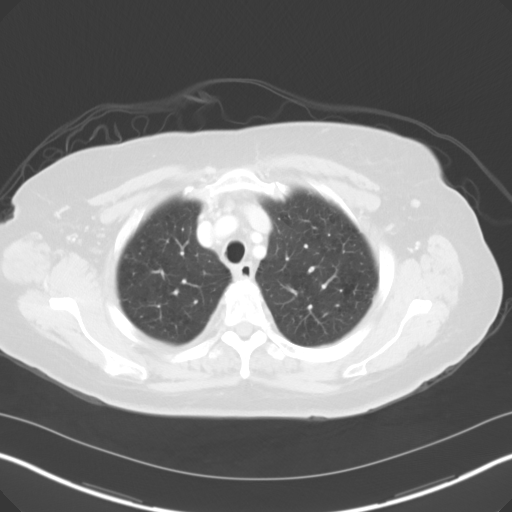

[Series 4: coronals · coronal · 0.99mm/px · 3 of 122 slices shown]
[im 25/122  lung]
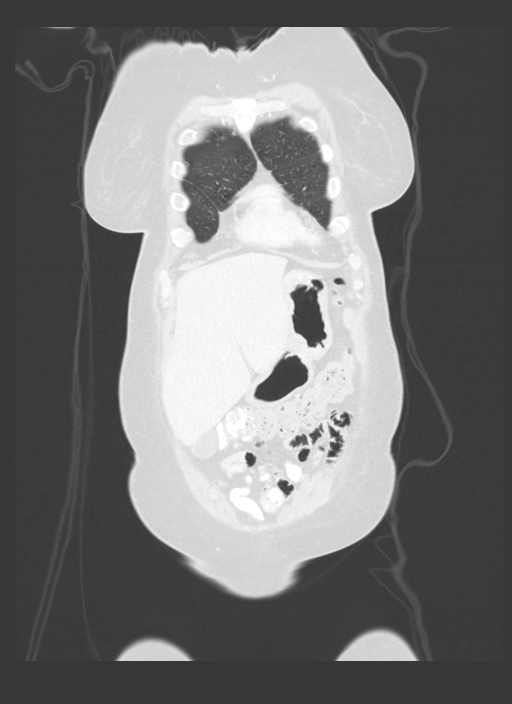
[im 49/122  lung]
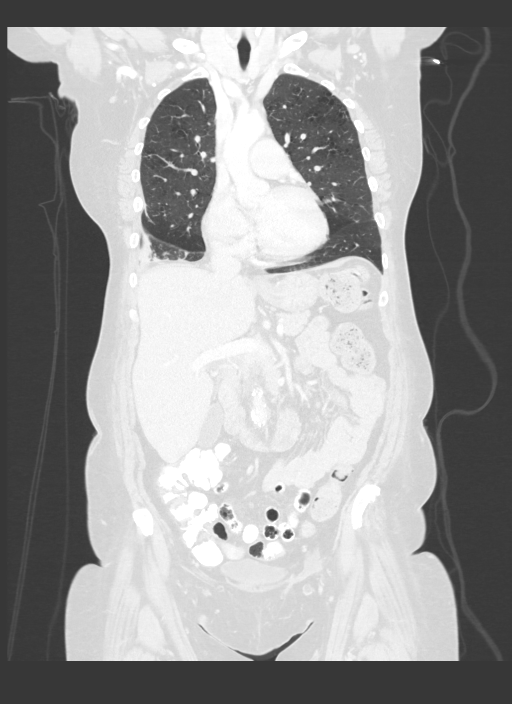
[im 73/122  lung]
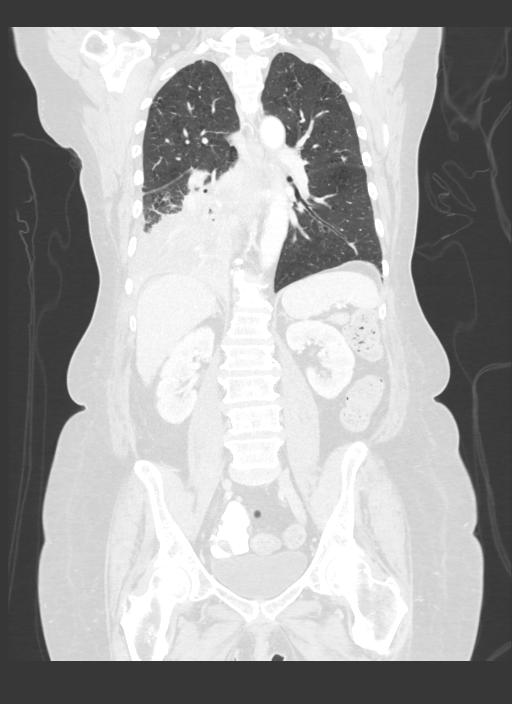

[12 of 36 positions shown; findings below may reference images not displayed]

FINDINGS: CT CHEST FINDINGS

Cardiovascular: The pulmonary artery is mildly enlarged, measuring
3.2 cm in diameter. There are vascular calcifications of the
coronary arteries, aortic arch, and abdominal aorta. Normal heart
size. No pericardial effusion.

Mediastinum/Nodes: A paratracheal lymph node measures 2.1 x 0.9 cm
(series 2, image 24). An enlarged lymph node versus a conglomeration
of lymph nodes in the subcarinal/right infrahilar area appears to be
partially necrotic and measures 2.9 x 1.7 cm (series 2, image 34).
The thyroid and esophagus appear normal.

Lungs/Pleura: A necrotic mass in the right lower lobe measures 6.3 x
5.7 cm, similar to prior exam. The previously described gas within
this mass is no longer identified. There is consolidation and
ground-glass opacity of the remainder of the right lower lobe which
may represent spread of tumor versus superimposed infection.

Centrilobular emphysema is noted. Fibrosis/scarring is seen in both
upper lobes and to a lesser extent of the left lower lobe which is
not significantly changed since 03/21/2015.

Musculoskeletal: No chest wall mass or suspicious bone lesions
identified.

CT ABDOMEN PELVIS FINDINGS

Hepatobiliary: No focal liver abnormality is seen. No gallstones,
gallbladder wall thickening, or biliary dilatation.

Pancreas: Unremarkable. No pancreatic ductal dilatation or
surrounding inflammatory changes.

Spleen: Normal in size without focal abnormality.

Adrenals/Urinary Tract: A left adrenal adenoma measures 1.3 cm. A
right renal cyst measures 1.3 cm. Otherwise, the. Kidneys are
normal, without renal calculi, focal lesion, or hydronephrosis.
Bladder is unremarkable.

Stomach/Bowel: Stomach is within normal limits. Appendix appears
normal. No evidence of bowel wall thickening, distention, or
inflammatory changes. Enteric contrast reaches the transverse colon.

Vascular/Lymphatic: No significant vascular findings are present. No
enlarged abdominal or pelvic lymph nodes.

Reproductive: Status post hysterectomy. No adnexal masses.

Other: A soft tissue nodule is identified near the left deep
inguinal ring measuring 1.8 x 2.6 x 1.5 cm (series 2, image 107 and
series 5, image 107) is not significantly changed since 02/21/2017
and likely postsurgical. No abdominopelvic ascites.

Musculoskeletal: No suspicious lytic or blastic lesions are
identified. Degenerative changes are seen in the spine.
IMPRESSION: 1. Cavitary mass in the right lower lobe, similar to prior exam.
There is consolidation and ground-glass opacity of the remainder of
the right lower lobe which may represent spread of tumor versus
superimposed infection.
2. Mediastinal/right hilar adenopathy appears partially necrotic.
3. No evidence of metastatic disease in the abdomen or pelvis.

Aortic Atherosclerosis (FHIGM-0D3.3) and Emphysema (FHIGM-K4U.H).

## 2021-02-03 IMAGING — CT CT ABD-PELV W/ CM
2 of 5 series · 12 of 36 positions shown, 15 images · IV contrast (OMNIPAQUE)
Comparison: CT chest dated 12/15/2016.

CLINICAL DATA: History of chronic obstructive pulmonary disease,
heart failure, hypertension, sarcoidosis and a right lung mass.
Concern for metastatic disease.

EXAM:
CT CHEST, ABDOMEN, AND PELVIS WITH CONTRAST
TECHNIQUE: Multidetector CT imaging of the chest, abdomen and pelvis was
performed following the standard protocol during bolus
administration of intravenous contrast.
CONTRAST:  100mL OMNIPAQUE IOHEXOL 300 MG/ML  SOLN

[Series 2: cap with · axial · 0.71mm/px · z∈[+103,+598]mm · 9 of 125 slices shown, 12 images]
[im 13/125  mediastinal]
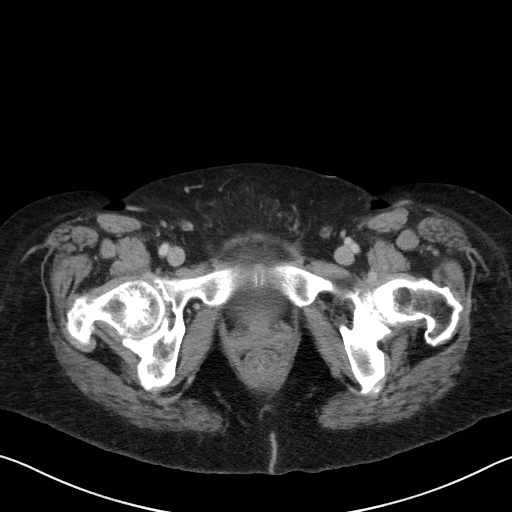
[im 13/125  lung]
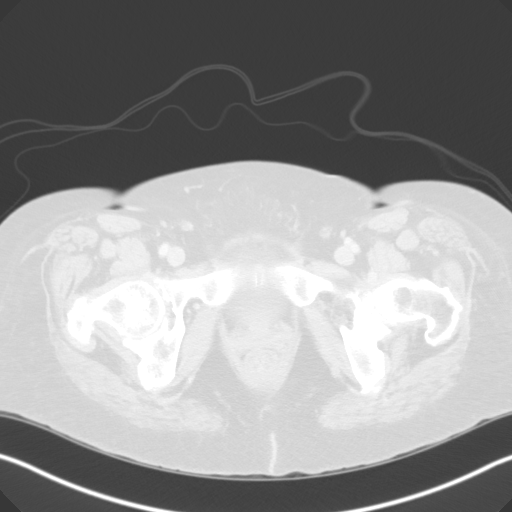
[im 25/125  lung]
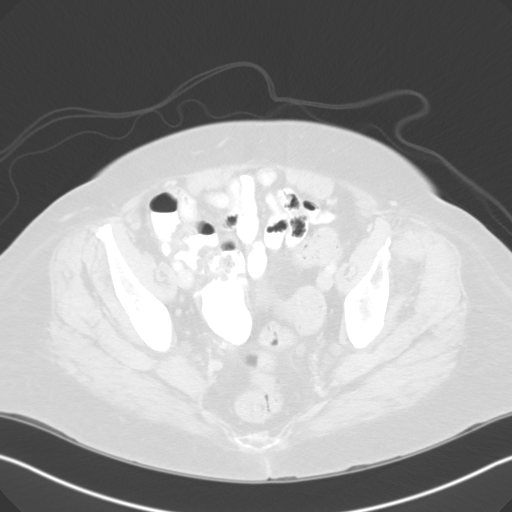
[im 38/125  lung]
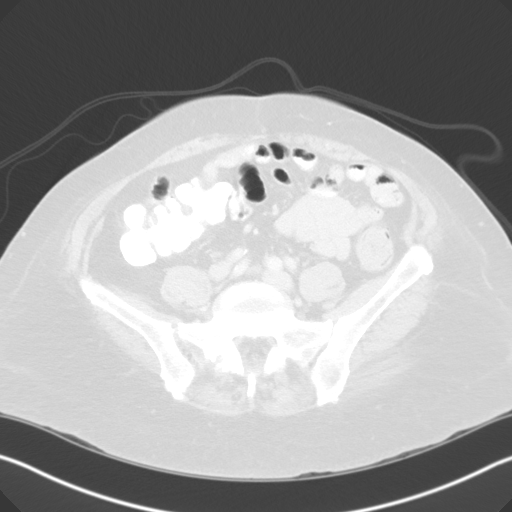
[im 50/125  lung]
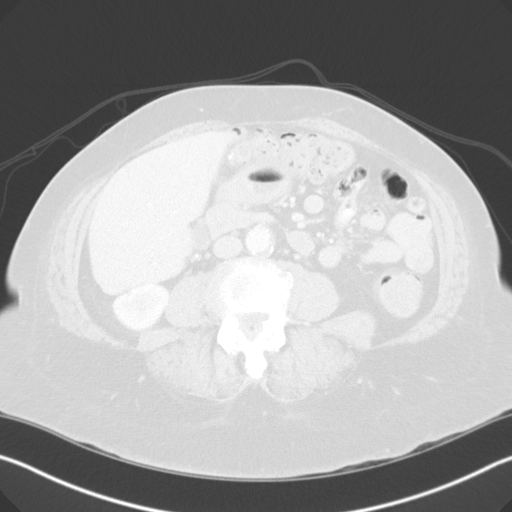
[im 63/125  mediastinal]
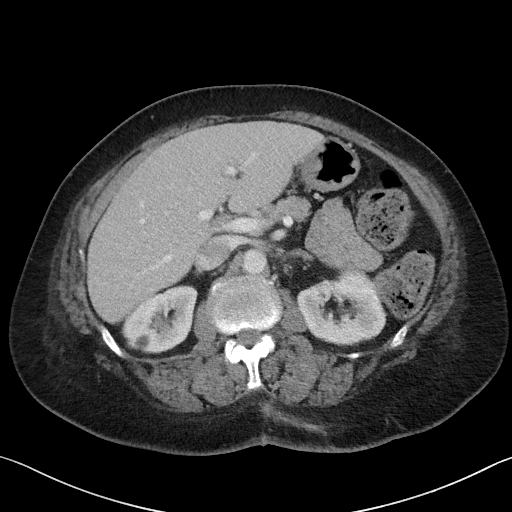
[im 63/125  lung]
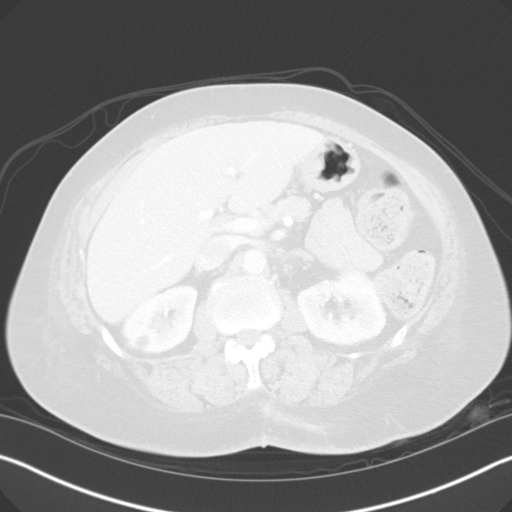
[im 75/125  lung]
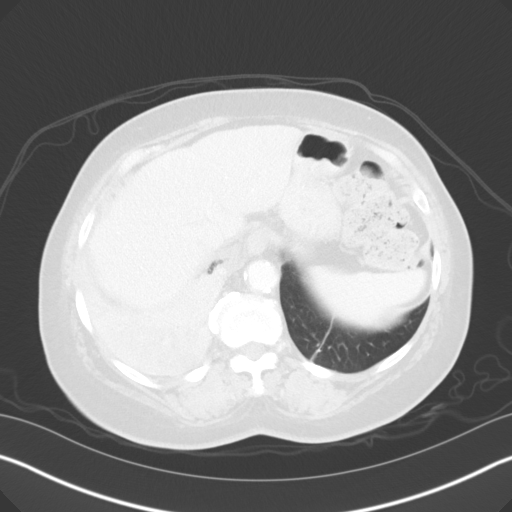
[im 87/125  lung]
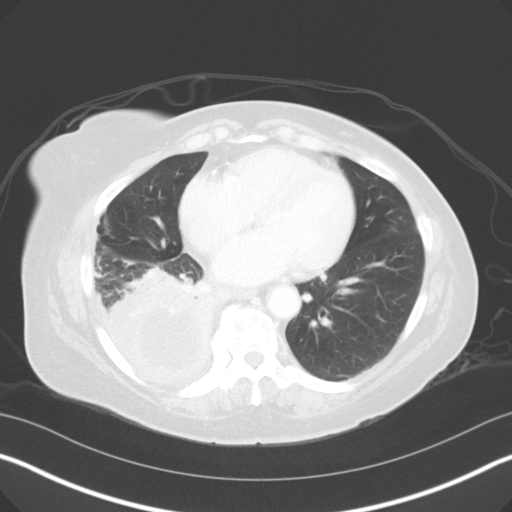
[im 100/125  lung]
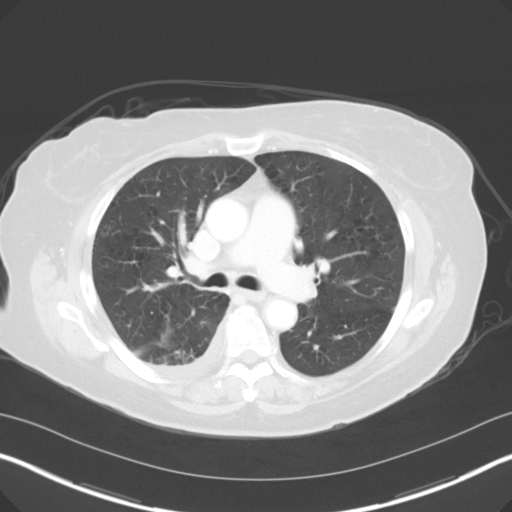
[im 112/125  mediastinal]
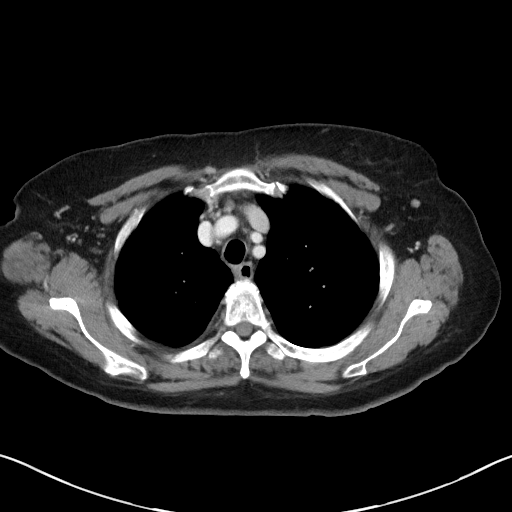
[im 112/125  lung]
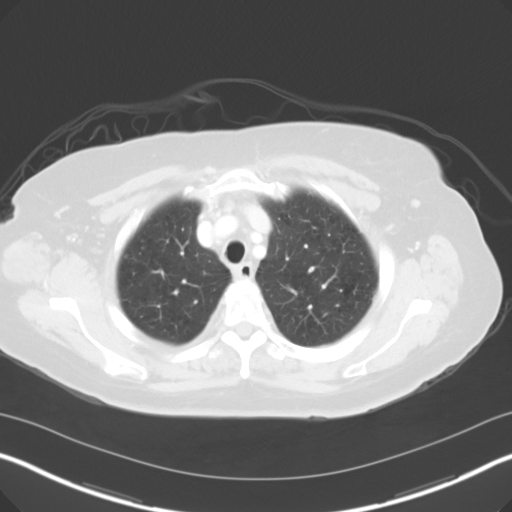

[Series 4: coronals · coronal · 0.99mm/px · 3 of 122 slices shown]
[im 25/122  lung]
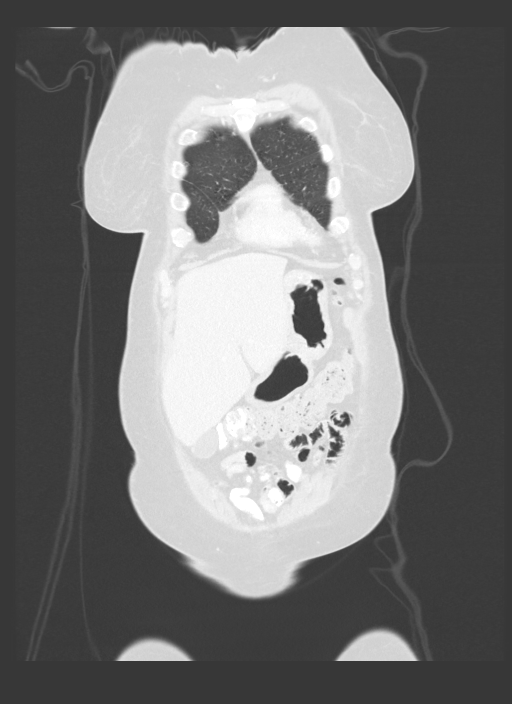
[im 49/122  lung]
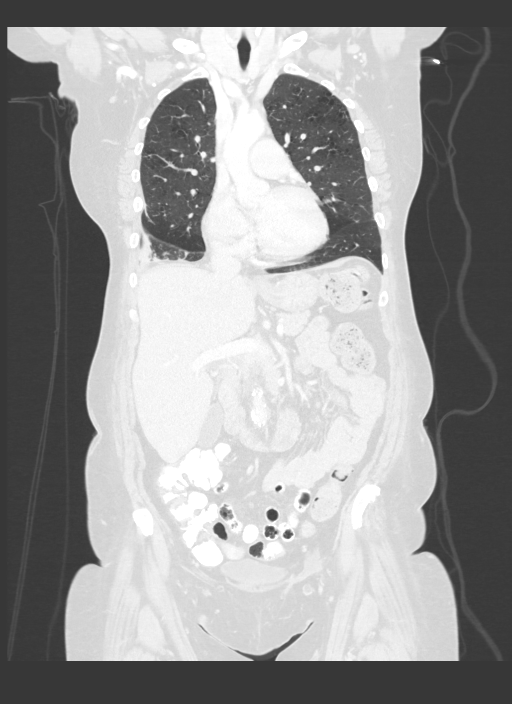
[im 73/122  lung]
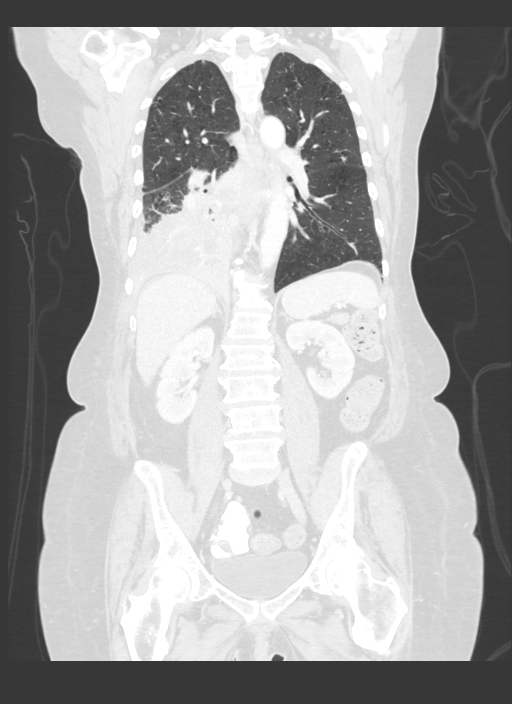

[12 of 36 positions shown; findings below may reference images not displayed]

FINDINGS: CT CHEST FINDINGS

Cardiovascular: The pulmonary artery is mildly enlarged, measuring
3.2 cm in diameter. There are vascular calcifications of the
coronary arteries, aortic arch, and abdominal aorta. Normal heart
size. No pericardial effusion.

Mediastinum/Nodes: A paratracheal lymph node measures 2.1 x 0.9 cm
(series 2, image 24). An enlarged lymph node versus a conglomeration
of lymph nodes in the subcarinal/right infrahilar area appears to be
partially necrotic and measures 2.9 x 1.7 cm (series 2, image 34).
The thyroid and esophagus appear normal.

Lungs/Pleura: A necrotic mass in the right lower lobe measures 6.3 x
5.7 cm, similar to prior exam. The previously described gas within
this mass is no longer identified. There is consolidation and
ground-glass opacity of the remainder of the right lower lobe which
may represent spread of tumor versus superimposed infection.

Centrilobular emphysema is noted. Fibrosis/scarring is seen in both
upper lobes and to a lesser extent of the left lower lobe which is
not significantly changed since 03/21/2015.

Musculoskeletal: No chest wall mass or suspicious bone lesions
identified.

CT ABDOMEN PELVIS FINDINGS

Hepatobiliary: No focal liver abnormality is seen. No gallstones,
gallbladder wall thickening, or biliary dilatation.

Pancreas: Unremarkable. No pancreatic ductal dilatation or
surrounding inflammatory changes.

Spleen: Normal in size without focal abnormality.

Adrenals/Urinary Tract: A left adrenal adenoma measures 1.3 cm. A
right renal cyst measures 1.3 cm. Otherwise, the. Kidneys are
normal, without renal calculi, focal lesion, or hydronephrosis.
Bladder is unremarkable.

Stomach/Bowel: Stomach is within normal limits. Appendix appears
normal. No evidence of bowel wall thickening, distention, or
inflammatory changes. Enteric contrast reaches the transverse colon.

Vascular/Lymphatic: No significant vascular findings are present. No
enlarged abdominal or pelvic lymph nodes.

Reproductive: Status post hysterectomy. No adnexal masses.

Other: A soft tissue nodule is identified near the left deep
inguinal ring measuring 1.8 x 2.6 x 1.5 cm (series 2, image 107 and
series 5, image 107) is not significantly changed since 02/21/2017
and likely postsurgical. No abdominopelvic ascites.

Musculoskeletal: No suspicious lytic or blastic lesions are
identified. Degenerative changes are seen in the spine.
IMPRESSION: 1. Cavitary mass in the right lower lobe, similar to prior exam.
There is consolidation and ground-glass opacity of the remainder of
the right lower lobe which may represent spread of tumor versus
superimposed infection.
2. Mediastinal/right hilar adenopathy appears partially necrotic.
3. No evidence of metastatic disease in the abdomen or pelvis.

Aortic Atherosclerosis (FHIGM-0D3.3) and Emphysema (FHIGM-[id number].H).
